# Patient Record
Sex: Male | Born: 1965 | Race: White | Hispanic: No | Marital: Married | Smoking: Former smoker
Health system: Southern US, Community
[De-identification: ages and names within clinical notes are randomized; demographics above are authoritative.]

## PROBLEM LIST (undated history)

## (undated) DIAGNOSIS — J189 Pneumonia, unspecified organism: Secondary | ICD-10-CM

## (undated) DIAGNOSIS — K5790 Diverticulosis of intestine, part unspecified, without perforation or abscess without bleeding: Secondary | ICD-10-CM

## (undated) DIAGNOSIS — M1990 Unspecified osteoarthritis, unspecified site: Secondary | ICD-10-CM

## (undated) DIAGNOSIS — I700 Atherosclerosis of aorta: Secondary | ICD-10-CM

## (undated) DIAGNOSIS — J432 Centrilobular emphysema: Secondary | ICD-10-CM

## (undated) DIAGNOSIS — K635 Polyp of colon: Secondary | ICD-10-CM

## (undated) DIAGNOSIS — K219 Gastro-esophageal reflux disease without esophagitis: Secondary | ICD-10-CM

## (undated) DIAGNOSIS — E559 Vitamin D deficiency, unspecified: Secondary | ICD-10-CM

## (undated) DIAGNOSIS — K654 Sclerosing mesenteritis: Secondary | ICD-10-CM

## (undated) DIAGNOSIS — I7121 Aneurysm of the ascending aorta, without rupture: Secondary | ICD-10-CM

## (undated) DIAGNOSIS — E538 Deficiency of other specified B group vitamins: Secondary | ICD-10-CM

## (undated) HISTORY — PX: TONSILLECTOMY: SUR1361

## (undated) HISTORY — DX: Morbid (severe) obesity due to excess calories: E66.01

## (undated) HISTORY — PX: COLONOSCOPY W/ POLYPECTOMY: SHX1380

## (undated) HISTORY — PX: JOINT REPLACEMENT: SHX530

## (undated) HISTORY — PX: HERNIA REPAIR: SHX51

---

## 1998-04-11 HISTORY — PX: APPENDECTOMY: SHX54

## 2007-02-09 ENCOUNTER — Emergency Department: Payer: Self-pay | Admitting: Emergency Medicine

## 2007-07-24 ENCOUNTER — Ambulatory Visit: Payer: Self-pay | Admitting: Unknown Physician Specialty

## 2007-08-10 ENCOUNTER — Ambulatory Visit: Payer: Self-pay | Admitting: Unknown Physician Specialty

## 2013-11-20 ENCOUNTER — Ambulatory Visit: Payer: Self-pay | Admitting: Physician Assistant

## 2018-10-11 ENCOUNTER — Other Ambulatory Visit: Payer: Self-pay

## 2018-10-24 DIAGNOSIS — M19071 Primary osteoarthritis, right ankle and foot: Secondary | ICD-10-CM | POA: Insufficient documentation

## 2018-10-24 DIAGNOSIS — M1712 Unilateral primary osteoarthritis, left knee: Secondary | ICD-10-CM | POA: Insufficient documentation

## 2018-10-24 DIAGNOSIS — M1711 Unilateral primary osteoarthritis, right knee: Secondary | ICD-10-CM | POA: Insufficient documentation

## 2018-10-25 ENCOUNTER — Other Ambulatory Visit: Payer: Self-pay | Admitting: Orthopedic Surgery

## 2018-10-25 DIAGNOSIS — M25362 Other instability, left knee: Secondary | ICD-10-CM

## 2018-10-25 DIAGNOSIS — M1712 Unilateral primary osteoarthritis, left knee: Secondary | ICD-10-CM

## 2018-10-25 DIAGNOSIS — M2392 Unspecified internal derangement of left knee: Secondary | ICD-10-CM

## 2018-10-25 DIAGNOSIS — M2352 Chronic instability of knee, left knee: Secondary | ICD-10-CM

## 2018-11-03 ENCOUNTER — Ambulatory Visit
Admission: RE | Admit: 2018-11-03 | Discharge: 2018-11-03 | Disposition: A | Discharge status: Home / Self Care | Payer: Managed Care, Other (non HMO) | Source: Ambulatory Visit | Attending: Orthopedic Surgery | Admitting: Orthopedic Surgery

## 2018-11-03 ENCOUNTER — Other Ambulatory Visit: Payer: Self-pay

## 2018-11-03 DIAGNOSIS — M2392 Unspecified internal derangement of left knee: Secondary | ICD-10-CM

## 2018-11-03 DIAGNOSIS — M2352 Chronic instability of knee, left knee: Secondary | ICD-10-CM

## 2018-11-03 DIAGNOSIS — M25362 Other instability, left knee: Secondary | ICD-10-CM

## 2018-11-03 DIAGNOSIS — M1712 Unilateral primary osteoarthritis, left knee: Secondary | ICD-10-CM

## 2018-12-18 DIAGNOSIS — K429 Umbilical hernia without obstruction or gangrene: Secondary | ICD-10-CM | POA: Insufficient documentation

## 2019-02-10 NOTE — Discharge Instructions (Signed)
Instructions after Total Knee Replacement   Latia Mataya P. Kairyn Olmeda, Jr., M.D.     Dept. of Orthopaedics & Sports Medicine  [facility]  [address]  [place], [place]  [address]  Phone: [phone]   Fax: [phone]    DIET: Drink plenty of non-alcoholic fluids. Resume your normal diet. Include foods high in fiber.  ACTIVITY:  You may use crutches or a walker with weight-bearing as tolerated, unless instructed otherwise. You may be weaned off of the walker or crutches by your Physical Therapist.  Do NOT place pillows under the knee. Anything placed under the knee could limit your ability to straighten the knee.   Continue doing gentle exercises. Exercising will reduce the pain and swelling, increase motion, and prevent muscle weakness.   Please continue to use the TED compression stockings for 6 weeks. You may remove the stockings at night, but should reapply them in the morning. Do not drive or operate any equipment until instructed.  WOUND CARE:  Continue to use the PolarCare or ice packs periodically to reduce pain and swelling. You may bathe or shower after the staples are removed at the first office visit following surgery.  MEDICATIONS: You may resume your regular medications. Please take the pain medication as prescribed on the medication. Do not take pain medication on an empty stomach. You have been given a prescription for a blood thinner (Lovenox or Coumadin). Please take the medication as instructed. (NOTE: After completing a 2 week course of Lovenox, take one Enteric-coated aspirin once a day. This along with elevation will help reduce the possibility of phlebitis in your operated leg.) Do not drive or drink alcoholic beverages when taking pain medications.  CALL THE OFFICE FOR: Temperature above 101 degrees Excessive bleeding or drainage on the dressing. Excessive swelling, coldness, or paleness of the toes. Persistent nausea and vomiting.  FOLLOW-UP:  You  should have an appointment to return to the office in 10-14 days after surgery. Arrangements have been made for continuation of Physical Therapy (either home therapy or outpatient therapy).   [facility] Department Directory         [email or web address]       [email or web address]          Cardiology  Appointments: [place] - [phone] [place] - [phone]  Endocrinology  Appointments: [place] - [phone] [place] - [phone]  Gastroenterology  Appointments: [place] - [phone] [place] - [phone]        General Surgery   Appointments: [place] - [phone]  Internal Medicine/Family Medicine  Appointments: [place] - [phone] [place] - [phone] [place] - [phone]  Metabolic and Weigh Loss Surgery  Appointments: [place] - [phone]        Neurology  Appointments: [place] - [phone] [place] - [phone]  Neurosurgery  Appointments: [place] - [phone]  Obstetrics & Gynecology  Appointments: [place] - [phone] [place] - [phone]        Pediatrics  Appointments: [place] - [phone] [place] - [phone]  Physiatry  Appointments: [place] -[phone]  Physical Therapy  Appointments: [place] - [phone] [place] - [phone]        Podiatry  Appointments: [place] - [phone] [place] - [phone]  Pulmonology  Appointments: [place] - [phone]  Rheumatology  Appointments: [place] - [phone]        [place] Location: [facility]  [address] , [place]  [address]  [place] Location: [facility] [address] [place], [place]  [address]  [place] Location: [facility] [address] [place], [place]  [address]    

## 2019-02-10 NOTE — H&P (Signed)
. Encounter Date: 02/07/2019 . Gwenlyn Fudge, PA  [place] [facility] Chief Complaint:       Chief Complaint  Patient presents with  . Knee Pain    H & P LEFT KNEE    History of Present Illness:    Derek Joseph is a 53 y.o. male that presents to clinic today for his preoperative history and evaluation.  Patient presents unaccompanied. The patient is scheduled to undergo a left total knee arthroplasty on 02/20/19 by Dr. Marry Guan. His pain began approximately 4 years ago.  The pain is located primarily along the medial aspect of the knee. He reports associated increase in pain with weightbearing.  He denies associated numbness or tingling.   The patient's symptoms have progressed to the point that they decrease his quality of life. The patient has previously undergone conservative treatment including NSAIDS and activity modification without adequate control of his symptoms.  Patient denies any history of blood clots or cardiac issues.  Past Medical, Surgical, Family, Social History, Allergies, Medications:   Past Medical History: History reviewed. No pertinent past medical history.  Past Surgical History:       Past Surgical History:  Procedure Laterality Date  . APPENDECTOMY    . COLONOSCOPY W/BIOPSY  01/07/2016   Procedure: COLONOSCOPY, FLEXIBLE; WITH BIOPSY, SINGLE OR MULTIPLE;  Surgeon: Cora Daniels, MD;  Location: [facility] ENDO/BRONCH;  Service: Gastroenterology;;  . COLONOSCOPY W/REMOVAL LESIONS BY SNARE  01/07/2016   Procedure: COLONOSCOPY, FLEXIBLE; WITH REMOVAL OF TUMOR(S), POLYP(S), OR OTHER LESION(S) BY SNARE TECHNIQUE;  Surgeon: Cora Daniels, MD;  Location: [facility] ENDO/BRON[facility];  Service: Gastroenterology;;  . TONSILLECTOMY      Current Medications:        Current Outpatient Medications  Medication Sig Dispense Refill  . ibuprofen (MOTRIN) 600 MG tablet Take 600 mg by mouth every 8 (eight) hours as  needed       . meloxicam (MOBIC) 15 MG tablet Take 15 mg by mouth once daily        No current facility-administered medications for this visit.     Allergies:       Allergies  Allergen Reactions  . Codeine Other (See Comments) and Abdominal Pain    Severe constipation Constipation: immediate and severe    Social History:  Social History        Socioeconomic History  . Marital status: Married    Spouse name: [name]  . Number of children: 1  . Years of education: 41  . Highest education level: Not on file  Occupational History  . Occupation: Astronomer  Social Needs  . Financial resource strain: Not on file  . Food insecurity    Worry: Not on file    Inability: Not on file  . Transportation needs    Medical: Not on file    Non-medical: Not on file  Tobacco Use  . Smoking status: Former Smoker    Packs/day: 1.50    Years: 35.00    Pack years: 52.50    Types: Cigarettes    Quit date: 11/18/2012    Years since quitting: 6.2  . Smokeless tobacco: Never Used  Substance and Sexual Activity  . Alcohol use: Yes    Comment: every now and then  . Drug use: Yes    Types: Marijuana    Comment: every now and then  . Sexual activity: Yes    Partners: Female  Lifestyle  . Physical activity  Days per week: Not on file    Minutes per session: Not on file  . Stress: Not on file  Relationships  . Social Herbalist on phone: Not on file    Gets together: Not on file    Attends religious service: Not on file    Active member of club or organization: Not on file    Attends meetings of clubs or organizations: Not on file    Relationship status: Not on file  Other Topics Concern  . 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 Coronary Artery Disease (Blocked arteries around heart) Mother 79       MI, died  . Bipolar disorder Mother   . Depression Mother   . Skin cancer Mother        melanoma  . Asthma Son        as a young child  . Colon cancer Maternal Aunt   . No Known Problems Father   . Colon polyps Sister   . No Known Problems Brother   . Coronary Artery Disease (Blocked arteries around heart) Paternal Grandmother   . Diverticulitis Sister     Review of Systems:   A 10+ ROS was performed, reviewed, and the pertinent orthopaedic findings are documented in the HPI.    Physical Examination:   BP 130/80   Ht 167.6 cm (5\' 6" )   Wt (!) 123.9 kg (273 lb 3.2 oz)   BMI 44.10 kg/m   Patient is a well-developed, well-nourished male in no acute distress. Patient has normal mood and affect. Patient is alert and oriented to person, place, and time. Pupils are equal and round with synchronous movement. No injected sclera.    HEENT: Pupils equal and reactive to light, no injected sclera  Cardiovascular: Regular rate and rhythm, with no murmurs, rubs, or gallops.  Distal pulses palpable.  Respiratory: Lungs clear to auscultation bilaterally.   LeftKnee: Soft tissue swelling:mild Effusion:minimal Erythema:none Crepitance:mild Tenderness:medial Alignment:relative varus Mediolateral laxity:medial pseudolaxity Posterior BK:[id number] Patellar tracking:Good tracking without evidence of subluxation or tilt Atrophy:No significantatrophy.  Quadriceps tone was good. Range of motion:0/0/106degrees  Sensation intact over the saphenous, lateral sural cutaneous, superficial  fibular, and deep fibular nerve distributions.  Tests Performed/Reviewed:  X-rays  Anteroposterior, lateral, and sunrise views of the left knee were obtained.  Anteroposterior views reveal moderate to severe narrowing of the medial joint space with associated subchondral sclerosis.  Compartment shows moderate loss of joint space.  Osteophyte formation noted.  Lateral view reveals complete loss of medial joint space with bone-on-bone contact.  Fabella noted.  Patellofemoral view reveals loss of joint space with osteophyte formation.  Impression:     ICD-10-CM  1. Acute pain of left knee  M25.562  2. Primary osteoarthritis of left knee  M17.12   Plan:   The patient has end-stage degenerative changes of the left knee.  It was explained to the patient that the condition is progressive in nature.  Having failed conservative treatment, the patient has elected to proceed with a total joint arthroplasty.  The patient will undergo a total joint arthroplasty with Dr. Marry Guan.  The risks of surgery, including blood clot and infection, were discussed with the patient.  Measures to reduce these risks, including the use of anticoagulation, perioperative antibiotics, and early ambulation were discussed.  The importance of postoperative physical therapy was discussed with the patient. The patient elects to proceed with surgery. The patient is instructed to stop all blood thinners prior to surgery.  The patient is instructed to call the hospital the day before surgery to learn of the proper arrival time.    Contact our office with any questions or concerns.  Follow up as indicated, or sooner should any new problems arise, if conditions worsen, or if they are otherwise concerned.   Gwenlyn Fudge, PA [place] [facility] [place], [place] [address] Phone: [phone]  This note was generated in part with voice recognition software and I apologize  for any typographical errors that were not detected and corrected.

## 2019-02-11 ENCOUNTER — Encounter
Admission: RE | Admit: 2019-02-11 | Discharge: 2019-02-11 | Disposition: A | Discharge status: Home / Self Care | Payer: Managed Care, Other (non HMO) | Source: Ambulatory Visit | Attending: Orthopedic Surgery | Admitting: Orthopedic Surgery

## 2019-02-11 ENCOUNTER — Other Ambulatory Visit: Payer: Self-pay

## 2019-02-11 DIAGNOSIS — Z01810 Encounter for preprocedural cardiovascular examination: Secondary | ICD-10-CM | POA: Diagnosis not present

## 2019-02-11 DIAGNOSIS — Z01818 Encounter for other preprocedural examination: Secondary | ICD-10-CM | POA: Diagnosis not present

## 2019-02-11 HISTORY — DX: Unspecified osteoarthritis, unspecified site: M19.90

## 2019-02-11 LAB — COMPREHENSIVE METABOLIC PANEL
ALT: 22 U/L (ref 0–44)
AST: 17 U/L (ref 15–41)
Albumin: 4.2 g/dL (ref 3.5–5.0)
Alkaline Phosphatase: 107 U/L (ref 38–126)
Anion gap: 9 (ref 5–15)
BUN: 15 mg/dL (ref 6–20)
CO2: 28 mmol/L (ref 22–32)
Calcium: 9.2 mg/dL (ref 8.9–10.3)
Chloride: 100 mmol/L (ref 98–111)
Creatinine, Ser: 0.97 mg/dL (ref 0.61–1.24)
GFR calc Af Amer: >60 mL/min (ref >60)
GFR calc non Af Amer: >60 mL/min (ref >60)
Glucose, Bld: 75 mg/dL (ref 70–99)
Potassium: 4.3 mmol/L (ref 3.5–5.1)
Sodium: 137 mmol/L (ref 135–145)
Total Bilirubin: 0.6 mg/dL (ref 0.3–1.2)
Total Protein: 7.8 g/dL (ref 6.5–8.1)

## 2019-02-11 LAB — CBC
HCT: 48 % (ref 39.0–52.0)
Hemoglobin: 16.2 g/dL (ref 13.0–17.0)
MCH: 30.1 pg (ref 26.0–34.0)
MCHC: 33.8 g/dL (ref 30.0–36.0)
MCV: 89.1 fL (ref 80.0–100.0)
Platelets: 180 10*3/uL (ref 150–400)
RBC: 5.39 MIL/uL (ref 4.22–5.81)
RDW: 12 % (ref 11.5–15.5)
WBC: 6.9 10*3/uL (ref 4.0–10.5)
nRBC: 0 % (ref 0.0–0.2)

## 2019-02-11 LAB — SURGICAL PCR SCREEN
MRSA, PCR: NEGATIVE
Staphylococcus aureus: NEGATIVE

## 2019-02-11 LAB — TYPE AND SCREEN
ABO/RH(D): B POS
Antibody Screen: NEGATIVE

## 2019-02-11 LAB — PROTIME-INR
INR: 1 (ref 0.8–1.2)
Prothrombin Time: 12.6 seconds (ref 11.4–15.2)

## 2019-02-11 LAB — URINALYSIS, ROUTINE W REFLEX MICROSCOPIC
Bilirubin Urine: NEGATIVE
Glucose, UA: NEGATIVE mg/dL
Hgb urine dipstick: NEGATIVE
Ketones, ur: NEGATIVE mg/dL
Leukocytes,Ua: NEGATIVE
Nitrite: NEGATIVE
Protein, ur: NEGATIVE mg/dL
Specific Gravity, Urine: 1.009 (ref 1.005–1.030)
pH: 5 (ref 5.0–8.0)

## 2019-02-11 LAB — SEDIMENTATION RATE: Sed Rate: 6 mm/hr (ref 0–20)

## 2019-02-11 LAB — C-REACTIVE PROTEIN: CRP: <0.8 mg/dL (ref <1.0)

## 2019-02-11 LAB — APTT: aPTT: 29 seconds (ref 24–36)

## 2019-02-11 NOTE — Patient Instructions (Signed)
Your procedure is scheduled on: 02-20-19 [facility] Report to Same Day Surgery 2nd floor medical mall [facility] Entrance-take elevator on left to 2nd floor.  Check in with surgery information desk.) To find out your arrival time please call [phone] between 1PM - 3PM on 02-19-19 TUESDAY  Remember: Instructions that are not followed completely may result in serious medical risk, up to and including death, or upon the discretion of your surgeon and anesthesiologist your surgery may need to be rescheduled.    _x___ 1. Do not eat food after midnight the night before your procedure. NO GUM OR CANDY AFTER MIDNIGHT. You may drink clear liquids up to 2 hours before you are scheduled to arrive at the hospital for your procedure.  Do not drink clear liquids within 2 hours of your scheduled arrival to the hospital.  Clear liquids include  --Water or Apple juice without pulp  --Gatorade  --Black Coffee or Clear Tea (No milk, no creamers, do not add anything to the coffee or Tea   ____Ensure clear carbohydrate drink on the way to the hospital for bariatric patients  _X___Ensure clear carbohydrate drink 3 hours before surgery.     __x__ 2. No Alcohol for 24 hours before or after surgery.   __x__3. No Smoking or e-cigarettes for 24 prior to surgery.  Do not use any chewable tobacco products for at least 6 hour prior to surgery   ____  4. Bring all medications with you on the day of surgery if instructed.    __x__ 5. Notify your doctor if there is any change in your medical condition     (cold, fever, infections).    x___6. On the morning of surgery brush your teeth with toothpaste and water.  You may rinse your mouth with mouth wash if you wish.  Do not swallow any toothpaste or mouthwash.   Do not wear jewelry, make-up, hairpins, clips or nail polish.  Do not wear lotions, powders, or perfumes.   Do not shave 48 hours prior to surgery. Men may shave face and neck.  Do not bring valuables  to the hospital.    [facility] is not responsible for any belongings or valuables.               Contacts, dentures or bridgework may not be worn into surgery.  Leave your suitcase in the car. After surgery it may be brought to your room.  For patients admitted to the hospital, discharge time is determined by your treatment team.  _  Patients discharged the day of surgery will not be allowed to drive home.  You will need someone to drive you home and stay with you the night of your procedure.    Please read over the following fact sheets that you were given:   [facility] Preparing for Surgery and or MRSA Information   ____ Take anti-hypertensive listed below, cardiac, seizure, asthma, anti-reflux and psychiatric medicines. These include:  1. NONE  2.  3.  4.  5.  6.  ____Fleets enema or Magnesium Citrate as directed.   _x___ Use CHG Soap or sage wipes as directed on instruction sheet   ____ Use inhalers on the day of surgery and bring to hospital day of surgery  ____ Stop Metformin and Janumet 2 days prior to surgery.    ____ Take 1/2 of usual insulin dose the night before surgery and none on the morning surgery.   ____ Follow recommendations from Cardiologist, Pulmonologist or PCP  regarding stopping Aspirin, Coumadin, Plavix ,Eliquis, Effient, or Pradaxa, and Pletal.  X____Stop Anti-inflammatories such as Advil, Aleve, Ibuprofen, Motrin, Naproxen, MELOXICAM, Naprosyn, Goodies powders or aspirin products 7 DAYS PRIOR TO SURGERY- OK to take Tylenol    ____ Stop supplements until after surgery.     ____ Bring C-Pap to the hospital.

## 2019-02-12 LAB — URINE CULTURE
Culture: NO GROWTH
Special Requests: NORMAL

## 2019-02-15 ENCOUNTER — Other Ambulatory Visit
Admission: RE | Admit: 2019-02-15 | Discharge: 2019-02-15 | Disposition: A | Discharge status: Home / Self Care | Payer: Managed Care, Other (non HMO) | Source: Ambulatory Visit | Attending: Orthopedic Surgery | Admitting: Orthopedic Surgery

## 2019-02-15 ENCOUNTER — Other Ambulatory Visit: Payer: Self-pay

## 2019-02-15 DIAGNOSIS — Z01812 Encounter for preprocedural laboratory examination: Secondary | ICD-10-CM | POA: Insufficient documentation

## 2019-02-15 DIAGNOSIS — Z20828 Contact with and (suspected) exposure to other viral communicable diseases: Secondary | ICD-10-CM | POA: Insufficient documentation

## 2019-02-15 LAB — SARS CORONAVIRUS 2 (TAT 6-24 HRS): SARS Coronavirus 2: NEGATIVE

## 2019-02-19 MED ORDER — TRANEXAMIC ACID-NACL 1000-0.7 MG/100ML-% IV SOLN
1000.0000 mg | INTRAVENOUS | Status: AC
  Administered 2019-02-20: 1000 mg via INTRAVENOUS
  Filled 2019-02-19: qty 100

## 2019-02-20 ENCOUNTER — Inpatient Hospital Stay: Payer: Managed Care, Other (non HMO)

## 2019-02-20 ENCOUNTER — Encounter: Payer: Self-pay | Admitting: Orthopedic Surgery

## 2019-02-20 ENCOUNTER — Inpatient Hospital Stay
Admission: RE | Admit: 2019-02-20 | Discharge: 2019-02-22 | DRG: 470 | Disposition: A | Discharge status: Home Health | Payer: Managed Care, Other (non HMO) | Source: Ambulatory Visit | Attending: Orthopedic Surgery | Admitting: Orthopedic Surgery

## 2019-02-20 ENCOUNTER — Encounter
Admission: RE | Disposition: A | Discharge status: Home Health | Payer: Self-pay | Source: Ambulatory Visit | Attending: Orthopedic Surgery

## 2019-02-20 ENCOUNTER — Inpatient Hospital Stay: Payer: Managed Care, Other (non HMO) | Admitting: Anesthesiology

## 2019-02-20 ENCOUNTER — Other Ambulatory Visit: Payer: Self-pay

## 2019-02-20 DIAGNOSIS — Z885 Allergy status to narcotic agent status: Secondary | ICD-10-CM

## 2019-02-20 DIAGNOSIS — M1712 Unilateral primary osteoarthritis, left knee: Principal | ICD-10-CM | POA: Diagnosis present

## 2019-02-20 DIAGNOSIS — Z87891 Personal history of nicotine dependence: Secondary | ICD-10-CM

## 2019-02-20 DIAGNOSIS — Z79899 Other long term (current) drug therapy: Secondary | ICD-10-CM

## 2019-02-20 DIAGNOSIS — Z86010 Personal history of colonic polyps: Secondary | ICD-10-CM

## 2019-02-20 DIAGNOSIS — M25562 Pain in left knee: Secondary | ICD-10-CM | POA: Diagnosis present

## 2019-02-20 DIAGNOSIS — Z6841 Body Mass Index (BMI) 40.0 and over, adult: Secondary | ICD-10-CM

## 2019-02-20 DIAGNOSIS — Z800 Family history of malignant neoplasm of digestive organs: Secondary | ICD-10-CM | POA: Diagnosis not present

## 2019-02-20 DIAGNOSIS — Z96659 Presence of unspecified artificial knee joint: Secondary | ICD-10-CM

## 2019-02-20 DIAGNOSIS — Z96652 Presence of left artificial knee joint: Secondary | ICD-10-CM

## 2019-02-20 HISTORY — PX: KNEE ARTHROPLASTY: SHX992

## 2019-02-20 LAB — URINE DRUG SCREEN, QUALITATIVE (ARMC ONLY)
Amphetamines, Ur Screen: NOT DETECTED
Barbiturates, Ur Screen: NOT DETECTED
Benzodiazepine, Ur Scrn: NOT DETECTED
Cannabinoid 50 Ng, Ur ~~LOC~~: NOT DETECTED
Cocaine Metabolite,Ur ~~LOC~~: NOT DETECTED
MDMA (Ecstasy)Ur Screen: NOT DETECTED
Methadone Scn, Ur: NOT DETECTED
Opiate, Ur Screen: NOT DETECTED
Phencyclidine (PCP) Ur S: NOT DETECTED
Tricyclic, Ur Screen: NOT DETECTED

## 2019-02-20 LAB — ABO/RH: ABO/RH(D): B POS

## 2019-02-20 SURGERY — ARTHROPLASTY, KNEE, TOTAL, USING IMAGELESS COMPUTER-ASSISTED NAVIGATION
Anesthesia: Spinal | Site: Knee | Laterality: Left

## 2019-02-20 MED ORDER — BUPIVACAINE HCL (PF) 0.5 % IJ SOLN
INTRAMUSCULAR | Status: DC | PRN
  Administered 2019-02-20: 3 mL

## 2019-02-20 MED ORDER — NEOMYCIN-POLYMYXIN B GU 40-200000 IR SOLN
Status: AC
  Filled 2019-02-20: qty 40

## 2019-02-20 MED ORDER — DIPHENHYDRAMINE HCL 12.5 MG/5ML PO ELIX: 12.5000 mg | ORAL_SOLUTION | ORAL | Status: DC | PRN

## 2019-02-20 MED ORDER — CELECOXIB 200 MG PO CAPS
ORAL_CAPSULE | ORAL | Status: AC
  Administered 2019-02-20: 400 mg via ORAL
  Filled 2019-02-20: qty 2

## 2019-02-20 MED ORDER — TRANEXAMIC ACID-NACL 1000-0.7 MG/100ML-% IV SOLN
1000.0000 mg | Freq: Once | INTRAVENOUS | Status: AC
  Administered 2019-02-20: 1000 mg via INTRAVENOUS
  Filled 2019-02-20: qty 100

## 2019-02-20 MED ORDER — METOCLOPRAMIDE HCL 5 MG/ML IJ SOLN: 5.0000 mg | Freq: Three times a day (TID) | INTRAMUSCULAR | Status: DC | PRN

## 2019-02-20 MED ORDER — PROPOFOL 10 MG/ML IV BOLUS
INTRAVENOUS | Status: AC
  Filled 2019-02-20: qty 20

## 2019-02-20 MED ORDER — MIDAZOLAM HCL 2 MG/2ML IJ SOLN
INTRAMUSCULAR | Status: AC
  Filled 2019-02-20: qty 2

## 2019-02-20 MED ORDER — TETRACAINE HCL 1 % IJ SOLN
INTRAMUSCULAR | Status: DC | PRN
  Administered 2019-02-20: 2 mg via INTRASPINAL

## 2019-02-20 MED ORDER — GABAPENTIN 300 MG PO CAPS
300.0000 mg | ORAL_CAPSULE | Freq: Every day | ORAL | Status: DC
  Administered 2019-02-20 – 2019-02-21 (×2): 300 mg via ORAL
  Filled 2019-02-20 (×2): qty 1

## 2019-02-20 MED ORDER — CEFAZOLIN SODIUM-DEXTROSE 2-4 GM/100ML-% IV SOLN
INTRAVENOUS | Status: AC
  Filled 2019-02-20: qty 100

## 2019-02-20 MED ORDER — PROPOFOL 10 MG/ML IV BOLUS
INTRAVENOUS | Status: DC | PRN
  Administered 2019-02-20 (×3): 10 mg via INTRAVENOUS
  Administered 2019-02-20: 20 mg via INTRAVENOUS
  Administered 2019-02-20: 40 mg via INTRAVENOUS

## 2019-02-20 MED ORDER — ACETAMINOPHEN 10 MG/ML IV SOLN
1000.0000 mg | Freq: Four times a day (QID) | INTRAVENOUS | Status: AC
  Administered 2019-02-20 – 2019-02-21 (×3): 1000 mg via INTRAVENOUS
  Filled 2019-02-20 (×3): qty 100

## 2019-02-20 MED ORDER — FAMOTIDINE 20 MG PO TABS
ORAL_TABLET | ORAL | Status: AC
  Administered 2019-02-20: 20 mg via ORAL
  Filled 2019-02-20: qty 1

## 2019-02-20 MED ORDER — PROPOFOL 500 MG/50ML IV EMUL
INTRAVENOUS | Status: AC
  Filled 2019-02-20: qty 50

## 2019-02-20 MED ORDER — EPHEDRINE SULFATE 50 MG/ML IJ SOLN
INTRAMUSCULAR | Status: DC | PRN
  Administered 2019-02-20 (×2): 10 mg via INTRAVENOUS

## 2019-02-20 MED ORDER — ACETAMINOPHEN 325 MG PO TABS: 325.0000 mg | ORAL_TABLET | Freq: Four times a day (QID) | ORAL | Status: DC | PRN

## 2019-02-20 MED ORDER — METOCLOPRAMIDE HCL 10 MG PO TABS: 5.0000 mg | ORAL_TABLET | Freq: Three times a day (TID) | ORAL | Status: DC | PRN

## 2019-02-20 MED ORDER — PHENYLEPHRINE HCL (PRESSORS) 10 MG/ML IV SOLN
INTRAVENOUS | Status: DC | PRN
  Administered 2019-02-20: 100 ug via INTRAVENOUS
  Administered 2019-02-20: 50 ug via INTRAVENOUS
  Administered 2019-02-20 (×2): 100 ug via INTRAVENOUS
  Administered 2019-02-20: 50 ug via INTRAVENOUS
  Administered 2019-02-20 (×2): 100 ug via INTRAVENOUS
  Administered 2019-02-20: 50 ug via INTRAVENOUS
  Administered 2019-02-20: 100 ug via INTRAVENOUS

## 2019-02-20 MED ORDER — BUPIVACAINE HCL (PF) 0.25 % IJ SOLN
INTRAMUSCULAR | Status: DC | PRN
  Administered 2019-02-20: 60 mL

## 2019-02-20 MED ORDER — CELECOXIB 200 MG PO CAPS
400.0000 mg | ORAL_CAPSULE | Freq: Once | ORAL | Status: AC
  Administered 2019-02-20: 07:00:00 400 mg via ORAL

## 2019-02-20 MED ORDER — PROPOFOL 10 MG/ML IV BOLUS
INTRAVENOUS | Status: AC
  Filled 2019-02-20: qty 40

## 2019-02-20 MED ORDER — SODIUM CHLORIDE 0.9 % IV SOLN
INTRAVENOUS | Status: DC | PRN
  Administered 2019-02-20: 60 mL

## 2019-02-20 MED ORDER — FENTANYL CITRATE (PF) 100 MCG/2ML IJ SOLN: 25.0000 ug | INTRAMUSCULAR | Status: DC | PRN

## 2019-02-20 MED ORDER — OXYCODONE HCL 5 MG PO TABS
5.0000 mg | ORAL_TABLET | ORAL | Status: DC | PRN
  Administered 2019-02-20 – 2019-02-21 (×3): 5 mg via ORAL
  Filled 2019-02-20 (×4): qty 1

## 2019-02-20 MED ORDER — ONDANSETRON HCL 4 MG PO TABS: 4.0000 mg | ORAL_TABLET | Freq: Four times a day (QID) | ORAL | Status: DC | PRN

## 2019-02-20 MED ORDER — DEXMEDETOMIDINE HCL IN NACL 80 MCG/20ML IV SOLN
INTRAVENOUS | Status: AC
  Filled 2019-02-20: qty 20

## 2019-02-20 MED ORDER — PHENYLEPHRINE HCL (PRESSORS) 10 MG/ML IV SOLN
INTRAVENOUS | Status: AC
  Filled 2019-02-20: qty 1

## 2019-02-20 MED ORDER — METOCLOPRAMIDE HCL 10 MG PO TABS
10.0000 mg | ORAL_TABLET | Freq: Three times a day (TID) | ORAL | Status: DC
  Administered 2019-02-20 – 2019-02-22 (×6): 10 mg via ORAL
  Filled 2019-02-20 (×6): qty 1

## 2019-02-20 MED ORDER — ALUM & MAG HYDROXIDE-SIMETH 200-200-20 MG/5ML PO SUSP: 30.0000 mL | ORAL | Status: DC | PRN

## 2019-02-20 MED ORDER — ENSURE PRE-SURGERY PO LIQD
296.0000 mL | Freq: Once | ORAL | Status: DC
  Filled 2019-02-20: qty 296

## 2019-02-20 MED ORDER — SODIUM CHLORIDE FLUSH 0.9 % IV SOLN
INTRAVENOUS | Status: AC
  Filled 2019-02-20: qty 80

## 2019-02-20 MED ORDER — SENNOSIDES-DOCUSATE SODIUM 8.6-50 MG PO TABS
1.0000 | ORAL_TABLET | Freq: Two times a day (BID) | ORAL | Status: DC
  Administered 2019-02-20 – 2019-02-22 (×4): 1 via ORAL
  Filled 2019-02-20 (×4): qty 1

## 2019-02-20 MED ORDER — OXYCODONE HCL 5 MG PO TABS
10.0000 mg | ORAL_TABLET | ORAL | Status: DC | PRN
  Administered 2019-02-20 – 2019-02-22 (×3): 10 mg via ORAL
  Filled 2019-02-20 (×4): qty 2

## 2019-02-20 MED ORDER — MIDAZOLAM HCL 5 MG/5ML IJ SOLN
INTRAMUSCULAR | Status: DC | PRN
  Administered 2019-02-20: 2 mg via INTRAVENOUS

## 2019-02-20 MED ORDER — ACETAMINOPHEN 10 MG/ML IV SOLN
INTRAVENOUS | Status: DC | PRN
  Administered 2019-02-20: 1000 mg via INTRAVENOUS

## 2019-02-20 MED ORDER — HYDROMORPHONE HCL 1 MG/ML IJ SOLN: 0.5000 mg | INTRAMUSCULAR | Status: DC | PRN

## 2019-02-20 MED ORDER — PHENOL 1.4 % MT LIQD
1.0000 | OROMUCOSAL | Status: DC | PRN
  Filled 2019-02-20: qty 177

## 2019-02-20 MED ORDER — FLEET ENEMA 7-19 GM/118ML RE ENEM: 1.0000 | ENEMA | Freq: Once | RECTAL | Status: DC | PRN

## 2019-02-20 MED ORDER — CHLORHEXIDINE GLUCONATE 4 % EX LIQD: 60.0000 mL | Freq: Once | CUTANEOUS | Status: DC

## 2019-02-20 MED ORDER — CEFAZOLIN SODIUM-DEXTROSE 2-4 GM/100ML-% IV SOLN
2.0000 g | INTRAVENOUS | Status: AC
  Administered 2019-02-20: 2 g via INTRAVENOUS

## 2019-02-20 MED ORDER — BUPIVACAINE HCL (PF) 0.25 % IJ SOLN
INTRAMUSCULAR | Status: AC
  Filled 2019-02-20: qty 120

## 2019-02-20 MED ORDER — ONDANSETRON HCL 4 MG/2ML IJ SOLN
INTRAMUSCULAR | Status: AC
  Filled 2019-02-20: qty 2

## 2019-02-20 MED ORDER — ENOXAPARIN SODIUM 30 MG/0.3ML ~~LOC~~ SOLN
30.0000 mg | Freq: Two times a day (BID) | SUBCUTANEOUS | Status: DC
  Administered 2019-02-21 (×2): 30 mg via SUBCUTANEOUS
  Filled 2019-02-20 (×3): qty 0.3

## 2019-02-20 MED ORDER — PROPOFOL 500 MG/50ML IV EMUL
INTRAVENOUS | Status: DC | PRN
  Administered 2019-02-20: 110 ug/kg/min via INTRAVENOUS

## 2019-02-20 MED ORDER — FERROUS SULFATE 325 (65 FE) MG PO TABS
325.0000 mg | ORAL_TABLET | Freq: Two times a day (BID) | ORAL | Status: DC
  Administered 2019-02-21 – 2019-02-22 (×3): 325 mg via ORAL
  Filled 2019-02-20 (×3): qty 1

## 2019-02-20 MED ORDER — LIDOCAINE HCL (PF) 2 % IJ SOLN
INTRAMUSCULAR | Status: AC
  Filled 2019-02-20: qty 10

## 2019-02-20 MED ORDER — GLYCOPYRROLATE 0.2 MG/ML IJ SOLN
INTRAMUSCULAR | Status: DC | PRN
  Administered 2019-02-20: 0.2 mg via INTRAVENOUS

## 2019-02-20 MED ORDER — SUCCINYLCHOLINE CHLORIDE 20 MG/ML IJ SOLN
INTRAMUSCULAR | Status: AC
  Filled 2019-02-20: qty 1

## 2019-02-20 MED ORDER — CEFAZOLIN SODIUM-DEXTROSE 2-4 GM/100ML-% IV SOLN
2.0000 g | Freq: Four times a day (QID) | INTRAVENOUS | Status: AC
  Administered 2019-02-20 – 2019-02-21 (×4): 2 g via INTRAVENOUS
  Filled 2019-02-20 (×4): qty 100

## 2019-02-20 MED ORDER — ONDANSETRON HCL 4 MG/2ML IJ SOLN
INTRAMUSCULAR | Status: DC | PRN
  Administered 2019-02-20: 4 mg via INTRAVENOUS

## 2019-02-20 MED ORDER — LACTATED RINGERS IV SOLN
INTRAVENOUS | Status: DC
  Administered 2019-02-20 (×2): via INTRAVENOUS

## 2019-02-20 MED ORDER — CELECOXIB 200 MG PO CAPS
200.0000 mg | ORAL_CAPSULE | Freq: Two times a day (BID) | ORAL | Status: DC
  Administered 2019-02-20 – 2019-02-22 (×4): 200 mg via ORAL
  Filled 2019-02-20 (×4): qty 1

## 2019-02-20 MED ORDER — ONDANSETRON HCL 4 MG/2ML IJ SOLN: 4.0000 mg | Freq: Four times a day (QID) | INTRAMUSCULAR | Status: DC | PRN

## 2019-02-20 MED ORDER — BUPIVACAINE HCL (PF) 0.5 % IJ SOLN
INTRAMUSCULAR | Status: AC
  Filled 2019-02-20: qty 10

## 2019-02-20 MED ORDER — GABAPENTIN 300 MG PO CAPS
ORAL_CAPSULE | ORAL | Status: AC
  Administered 2019-02-20: 300 mg via ORAL
  Filled 2019-02-20: qty 1

## 2019-02-20 MED ORDER — GABAPENTIN 300 MG PO CAPS
300.0000 mg | ORAL_CAPSULE | Freq: Once | ORAL | Status: AC
  Administered 2019-02-20: 07:00:00 300 mg via ORAL

## 2019-02-20 MED ORDER — ONDANSETRON HCL 4 MG/2ML IJ SOLN: 4.0000 mg | Freq: Once | INTRAMUSCULAR | Status: DC | PRN

## 2019-02-20 MED ORDER — MENTHOL 3 MG MT LOZG
1.0000 | LOZENGE | OROMUCOSAL | Status: DC | PRN
  Filled 2019-02-20: qty 9

## 2019-02-20 MED ORDER — SODIUM CHLORIDE 0.9 % IV SOLN
INTRAVENOUS | Status: DC | PRN
  Administered 2019-02-20: 10 ug/min via INTRAVENOUS

## 2019-02-20 MED ORDER — PANTOPRAZOLE SODIUM 40 MG PO TBEC
40.0000 mg | DELAYED_RELEASE_TABLET | Freq: Two times a day (BID) | ORAL | Status: DC
  Administered 2019-02-20 – 2019-02-22 (×4): 40 mg via ORAL
  Filled 2019-02-20 (×4): qty 1

## 2019-02-20 MED ORDER — NEOMYCIN-POLYMYXIN B GU 40-200000 IR SOLN
Status: DC | PRN
  Administered 2019-02-20: 14 mL

## 2019-02-20 MED ORDER — ACETAMINOPHEN 10 MG/ML IV SOLN
INTRAVENOUS | Status: AC
  Filled 2019-02-20: qty 100

## 2019-02-20 MED ORDER — SODIUM CHLORIDE 0.9 % IV SOLN
INTRAVENOUS | Status: DC
  Administered 2019-02-20 – 2019-02-21 (×3): via INTRAVENOUS

## 2019-02-20 MED ORDER — OXYCODONE HCL 5 MG PO TABS
ORAL_TABLET | ORAL | Status: AC
  Filled 2019-02-20: qty 1

## 2019-02-20 MED ORDER — FAMOTIDINE 20 MG PO TABS
20.0000 mg | ORAL_TABLET | Freq: Once | ORAL | Status: AC
  Administered 2019-02-20: 07:00:00 20 mg via ORAL

## 2019-02-20 MED ORDER — BISACODYL 10 MG RE SUPP: 10.0000 mg | Freq: Every day | RECTAL | Status: DC | PRN

## 2019-02-20 MED ORDER — DEXAMETHASONE SODIUM PHOSPHATE 10 MG/ML IJ SOLN
INTRAMUSCULAR | Status: AC
  Administered 2019-02-20: 8 mg via INTRAVENOUS
  Filled 2019-02-20: qty 1

## 2019-02-20 MED ORDER — TRAMADOL HCL 50 MG PO TABS
50.0000 mg | ORAL_TABLET | ORAL | Status: DC | PRN
  Administered 2019-02-20 – 2019-02-21 (×4): 50 mg via ORAL
  Administered 2019-02-22: 100 mg via ORAL
  Filled 2019-02-20 (×2): qty 1
  Filled 2019-02-20: qty 2
  Filled 2019-02-20 (×2): qty 1

## 2019-02-20 MED ORDER — DEXAMETHASONE SODIUM PHOSPHATE 10 MG/ML IJ SOLN
8.0000 mg | Freq: Once | INTRAMUSCULAR | Status: AC
  Administered 2019-02-20: 07:00:00 8 mg via INTRAVENOUS

## 2019-02-20 MED ORDER — BUPIVACAINE LIPOSOME 1.3 % IJ SUSP
INTRAMUSCULAR | Status: AC
  Filled 2019-02-20: qty 40

## 2019-02-20 MED ORDER — MAGNESIUM HYDROXIDE 400 MG/5ML PO SUSP
30.0000 mL | Freq: Every day | ORAL | Status: DC
  Administered 2019-02-21 – 2019-02-22 (×2): 30 mL via ORAL
  Filled 2019-02-20 (×2): qty 30

## 2019-02-20 SURGICAL SUPPLY — 79 items
ATTUNE MED DOME PAT 41 KNEE (Knees) ×1 IMPLANT
ATTUNE MED DOME PAT 41MM KNEE (Knees) ×1 IMPLANT
ATTUNE PS FEM LT SZ 7 CEM KNEE (Femur) ×2 IMPLANT
ATTUNE PSRP INSR SZ7 5 KNEE (Insert) ×1 IMPLANT
ATTUNE PSRP INSR SZ7 5MM KNEE (Insert) ×1 IMPLANT
BASE TIBIAL ROT PLAT SZ 7 KNEE (Knees) IMPLANT
BATTERY INSTRU NAVIGATION (MISCELLANEOUS) ×12 IMPLANT
BLADE SAW 70X12.5 (BLADE) ×3 IMPLANT
BLADE SAW 90X13X1.19 OSCILLAT (BLADE) ×3 IMPLANT
BLADE SAW 90X25X1.19 OSCILLAT (BLADE) ×3 IMPLANT
BONE CEMENT GENTAMICIN (Cement) ×6 IMPLANT
CANISTER SUCT 3000ML PPV (MISCELLANEOUS) ×3 IMPLANT
CEMENT BONE GENTAMICIN 40 (Cement) IMPLANT
COOLER ICEMAN CLASSIC (MISCELLANEOUS) ×3 IMPLANT
COVER WAND RF STERILE (DRAPES) ×3 IMPLANT
CUFF TOURN SGL QUICK 24 (TOURNIQUET CUFF)
CUFF TOURN SGL QUICK 30 (TOURNIQUET CUFF)
CUFF TOURN SGL QUICK 34 (TOURNIQUET CUFF) ×2
CUFF TRNQT CYL 24X4X16.5-23 (TOURNIQUET CUFF) IMPLANT
CUFF TRNQT CYL 30X4X21-28X (TOURNIQUET CUFF) IMPLANT
CUFF TRNQT CYL 34X4.125X (TOURNIQUET CUFF) IMPLANT
DRAPE 3/4 80X56 (DRAPES) ×3 IMPLANT
DRSG DERMACEA 8X12 NADH (GAUZE/BANDAGES/DRESSINGS) ×3 IMPLANT
DRSG OPSITE POSTOP 4X14 (GAUZE/BANDAGES/DRESSINGS) ×3 IMPLANT
DRSG TEGADERM 4X4.75 (GAUZE/BANDAGES/DRESSINGS) ×3 IMPLANT
DURAPREP 26ML APPLICATOR (WOUND CARE) ×6 IMPLANT
ELECT REM PT RETURN 9FT ADLT (ELECTROSURGICAL) ×3
ELECTRODE REM PT RTRN 9FT ADLT (ELECTROSURGICAL) ×1 IMPLANT
EX-PIN ORTHOLOCK NAV 4X150 (PIN) ×6 IMPLANT
GLOVE BIO SURGEON STRL SZ7.5 (GLOVE) ×6 IMPLANT
GLOVE BIOGEL M STRL SZ7.5 (GLOVE) ×6 IMPLANT
GLOVE BIOGEL PI IND STRL 7.5 (GLOVE) ×1 IMPLANT
GLOVE BIOGEL PI INDICATOR 7.5 (GLOVE) ×2
GLOVE INDICATOR 8.0 STRL GRN (GLOVE) ×3 IMPLANT
GOWN STRL REUS W/ TWL LRG LVL3 (GOWN DISPOSABLE) ×2 IMPLANT
GOWN STRL REUS W/ TWL XL LVL3 (GOWN DISPOSABLE) ×1 IMPLANT
GOWN STRL REUS W/TWL LRG LVL3 (GOWN DISPOSABLE) ×4
GOWN STRL REUS W/TWL XL LVL3 (GOWN DISPOSABLE) ×2
HEMOVAC 400CC 10FR (MISCELLANEOUS) ×3 IMPLANT
HOLDER FOLEY CATH W/STRAP (MISCELLANEOUS) ×3 IMPLANT
HOOD PEEL AWAY FLYTE STAYCOOL (MISCELLANEOUS) ×6 IMPLANT
KIT TURNOVER KIT A (KITS) ×3 IMPLANT
KNIFE SCULPS 14X20 (INSTRUMENTS) ×3 IMPLANT
LABEL OR SOLS (LABEL) ×3 IMPLANT
MANIFOLD NEPTUNE II (INSTRUMENTS) ×3 IMPLANT
NDL SAFETY ECLIPSE 18X1.5 (NEEDLE) ×1 IMPLANT
NDL SPNL 20GX3.5 QUINCKE YW (NEEDLE) ×2 IMPLANT
NEEDLE HYPO 18GX1.5 SHARP (NEEDLE) ×2
NEEDLE SPNL 20GX3.5 QUINCKE YW (NEEDLE) ×6 IMPLANT
NS IRRIG 500ML POUR BTL (IV SOLUTION) ×3 IMPLANT
PACK TOTAL KNEE (MISCELLANEOUS) ×3 IMPLANT
PAD COLD SHLDR UNI WRAP-ON (PAD) ×3
PAD COLD UNI WRAP-ON (PAD) IMPLANT
PAD WRAPON POLAR KNEE (MISCELLANEOUS) ×1 IMPLANT
PENCIL SMOKE ULTRAEVAC 22 CON (MISCELLANEOUS) ×3 IMPLANT
PIN DRILL QUICK PACK ×3 IMPLANT
PIN FIXATION 1/8DIA X 3INL (PIN) ×9 IMPLANT
PULSAVAC PLUS IRRIG FAN TIP (DISPOSABLE) ×3
SOL .9 NS 3000ML IRR  AL (IV SOLUTION) ×2
SOL .9 NS 3000ML IRR UROMATIC (IV SOLUTION) ×1 IMPLANT
SOL PREP PVP 2OZ (MISCELLANEOUS) ×3
SOLUTION PREP PVP 2OZ (MISCELLANEOUS) ×1 IMPLANT
SPONGE DRAIN TRACH 4X4 STRL 2S (GAUZE/BANDAGES/DRESSINGS) ×3 IMPLANT
STAPLER SKIN PROX 35W (STAPLE) ×3 IMPLANT
STOCKINETTE IMPERV 14X48 (MISCELLANEOUS) ×2 IMPLANT
STRAP TIBIA SHORT (MISCELLANEOUS) ×3 IMPLANT
SUCTION FRAZIER HANDLE 10FR (MISCELLANEOUS) ×2
SUCTION TUBE FRAZIER 10FR DISP (MISCELLANEOUS) ×1 IMPLANT
SUT VIC AB 0 CT1 36 (SUTURE) ×5 IMPLANT
SUT VIC AB 1 CT1 36 (SUTURE) ×6 IMPLANT
SUT VIC AB 2-0 CT2 27 (SUTURE) ×3 IMPLANT
SYR 20ML LL LF (SYRINGE) ×3 IMPLANT
SYR 30ML LL (SYRINGE) ×6 IMPLANT
TIBIAL BASE ROT PLAT SZ 7 KNEE (Knees) ×3 IMPLANT
TIP FAN IRRIG PULSAVAC PLUS (DISPOSABLE) ×1 IMPLANT
TOWEL OR 17X26 4PK STRL BLUE (TOWEL DISPOSABLE) ×3 IMPLANT
TOWER CARTRIDGE SMART MIX (DISPOSABLE) ×3 IMPLANT
TRAY FOLEY MTR SLVR 16FR STAT (SET/KITS/TRAYS/PACK) ×3 IMPLANT
WRAPON POLAR PAD KNEE (MISCELLANEOUS)

## 2019-02-20 NOTE — Anesthesia Post-op Follow-up Note (Signed)
Anesthesia QCDR form completed.        

## 2019-02-20 NOTE — Anesthesia Procedure Notes (Addendum)
Spinal  Patient location during procedure: OR Start time: 02/20/2019 7:26 AM End time: 02/20/2019 7:29 AM Staffing Anesthesiologist: Alvin Critchley, MD Performed: anesthesiologist  Preanesthetic Checklist Completed: patient identified, site marked, surgical consent, pre-op evaluation, timeout performed, IV checked, risks and benefits discussed and monitors and equipment checked Spinal Block Patient position: sitting Prep: Betadine Patient monitoring: heart rate, continuous pulse ox, blood pressure and cardiac monitor Approach: right paramedian Location: L3-4 Injection technique: single-shot Needle Needle type: Whitacre and Introducer  Needle gauge: 24 G Needle length: 9 cm Additional Notes Negative paresthesia. Negative blood return. Positive free-flowing CSF. Expiration date of kit checked and confirmed. Patient tolerated procedure well, without complications.

## 2019-02-20 NOTE — Transfer of Care (Signed)
Immediate Anesthesia Transfer of Care Note  Patient: Derek Joseph  Procedure(s) Performed: COMPUTER ASSISTED TOTAL KNEE ARTHROPLASTY (Left Knee)  Patient Location: PACU  Anesthesia Type:General and Spinal  Level of Consciousness: awake, oriented and drowsy  Airway & Oxygen Therapy: Patient Spontanous Breathing and Patient connected to face mask oxygen  Post-op Assessment: Report given to RN, Post -op Vital signs reviewed and stable and Patient moving all extremities X 4  Post vital signs: Reviewed and stable  Last Vitals:  Vitals Value Taken Time  BP 115/47 02/20/19 1128  Temp 37.6 C 02/20/19 1128  Pulse 87 02/20/19 1130  Resp 19 02/20/19 1130  SpO2 95 % 02/20/19 1130  Vitals shown include unvalidated device data.  Last Pain:  Vitals:   02/20/19 1128  TempSrc:   PainSc: 0-No pain         Complications: No apparent anesthesia complications

## 2019-02-20 NOTE — Op Note (Signed)
OPERATIVE NOTE  DATE OF SURGERY:  02/20/2019  PATIENT NAME:  Derek Joseph   DOB: 1965-08-05  MRN: [id number]  PRE-OPERATIVE DIAGNOSIS: Degenerative arthrosis of the left knee, primary  POST-OPERATIVE DIAGNOSIS:  Same  PROCEDURE:  Left total knee arthroplasty using computer-assisted navigation  SURGEON:  Marciano Sequin. M.D.  ASSISTANT: Cassell Smiles, PA-C (present and scrubbed throughout the case, critical for assistance with exposure, retraction, instrumentation, and closure)  ANESTHESIA: spinal  ESTIMATED BLOOD LOSS: 50 mL  FLUIDS REPLACED: 1300 mL of crystalloid  TOURNIQUET TIME: 100 minutes  DRAINS: 2 medium Hemovac drains  SOFT TISSUE RELEASES: Anterior cruciate ligament, posterior cruciate ligament, deep medial collateral ligament, patellofemoral ligament  IMPLANTS UTILIZED: DePuy Attune size 7 posterior stabilized femoral component (cemented), size 7 rotating platform tibial component (cemented), 41 mm medialized dome patella (cemented), and a 5 mm stabilized rotating platform polyethylene insert.  INDICATIONS FOR SURGERY: Derek Joseph is a 53 y.o. year old male with a long history of progressive knee pain. X-rays demonstrated severe degenerative changes in tricompartmental fashion. The patient had not seen any significant improvement despite conservative nonsurgical intervention. After discussion of the risks and benefits of surgical intervention, the patient expressed understanding of the risks benefits and agree with plans for total knee arthroplasty.   The risks, benefits, and alternatives were discussed at length including but not limited to the risks of infection, bleeding, nerve injury, stiffness, blood clots, the need for revision surgery, cardiopulmonary complications, among others, and they were willing to proceed.  PROCEDURE IN DETAIL: The patient was brought into the operating room and, after adequate spinal anesthesia was achieved, a tourniquet was placed  on the patient's upper thigh. The patient's knee and leg were cleaned and prepped with alcohol and DuraPrep and draped in the usual sterile fashion. A "timeout" was performed as per usual protocol. The lower extremity was exsanguinated using an Esmarch, and the tourniquet was inflated to 300 mmHg. An anterior longitudinal incision was made followed by a standard mid vastus approach. The deep fibers of the medial collateral ligament were elevated in a subperiosteal fashion off of the medial flare of the tibia so as to maintain a continuous soft tissue sleeve. The patella was subluxed laterally and the patellofemoral ligament was incised. Inspection of the knee demonstrated severe degenerative changes with full-thickness loss of articular cartilage. Osteophytes were debrided using a rongeur. Anterior and posterior cruciate ligaments were excised. Two 4.0 mm Schanz pins were inserted in the femur and into the tibia for attachment of the array of trackers used for computer-assisted navigation. Hip center was identified using a circumduction technique. Distal landmarks were mapped using the computer. The distal femur and proximal tibia were mapped using the computer. The distal femoral cutting guide was positioned using computer-assisted navigation so as to achieve a 5 distal valgus cut. The femur was sized and it was felt that a size 7 femoral component was appropriate. A size 7 femoral cutting guide was positioned and the anterior cut was performed and verified using the computer. This was followed by completion of the posterior and chamfer cuts. Femoral cutting guide for the central box was then positioned in the center box cut was performed.  Attention was then directed to the proximal tibia. Medial and lateral menisci were excised. The extramedullary tibial cutting guide was positioned using computer-assisted navigation so as to achieve a 0 varus-valgus alignment and 3 posterior slope. The cut was performed and  verified using the computer. The proximal tibia was  sized and it was felt that a size 7 tibial tray was appropriate. Tibial and femoral trials were inserted followed by insertion of a 5 mm polyethylene insert. This allowed for excellent mediolateral soft tissue balancing both in flexion and in full extension. Finally, the patella was cut and prepared so as to accommodate a 41 mm medialized dome patella. A patella trial was placed and the knee was placed through a range of motion with excellent patellar tracking appreciated. The femoral trial was removed after debridement of posterior osteophytes. The central post-hole for the tibial component was reamed followed by insertion of a keel punch. Tibial trials were then removed. Cut surfaces of bone were irrigated with copious amounts of normal saline with antibiotic solution using pulsatile lavage and then suctioned dry. Polymethylmethacrylate cement with gentamicin was prepared in the usual fashion using a vacuum mixer. Cement was applied to the cut surface of the proximal tibia as well as along the undersurface of a size 7 rotating platform tibial component. Tibial component was positioned and impacted into place. Excess cement was removed using [employer] fast streamer. Cement was then applied to the cut surfaces of the femur as well as along the posterior flanges of the size 7 femoral component. The femoral component was positioned and impacted into place. Excess cement was removed using [employer] fast streamer. A 5 mm polyethylene trial was inserted and the knee was brought into full extension with steady axial compression applied. Finally, cement was applied to the backside of a 41 mm medialized dome patella and the patellar component was positioned and patellar clamp applied. Excess cement was removed using [employer] fast streamer. After adequate curing of the cement, the tourniquet was deflated after a total tourniquet time of 100 minutes. Hemostasis was achieved using electrocautery.  The knee was irrigated with copious amounts of normal saline with antibiotic solution using pulsatile lavage and then suctioned dry. 20 mL of 1.3% Exparel and 60 mL of 0.25% Marcaine in 40 mL of normal saline was injected along the posterior capsule, medial and lateral gutters, and along the arthrotomy site. A 5 mm stabilized rotating platform polyethylene insert was inserted and the knee was placed through a range of motion with excellent mediolateral soft tissue balancing appreciated and excellent patellar tracking noted. 2 medium drains were placed in the wound bed and brought out through separate stab incisions. The medial parapatellar portion of the incision was reapproximated using interrupted sutures of #1 Vicryl. Subcutaneous tissue was approximated in layers using first #0 Vicryl followed #2-0 Vicryl. The skin was approximated with skin staples. A sterile dressing was applied.  The patient tolerated the procedure well and was transported to the recovery room in stable condition.    Derek Joseph., M.D.

## 2019-02-20 NOTE — Anesthesia Preprocedure Evaluation (Signed)
Anesthesia Evaluation  Patient identified by MRN, date of birth, ID band Patient awake    Reviewed: Allergy & Precautions, NPO status , Patient's Chart, lab work & pertinent test results  Airway Mallampati: II  TM Distance: >3 FB     Dental  (+) Chipped   Pulmonary Patient abstained from smoking., former smoker,    Pulmonary exam normal        Cardiovascular negative cardio ROS Normal cardiovascular exam     Neuro/Psych negative neurological ROS  negative psych ROS   GI/Hepatic negative GI ROS, Neg liver ROS,   Endo/Other  Morbid obesity  Renal/GU negative Renal ROS  negative genitourinary   Musculoskeletal  (+) Arthritis , Osteoarthritis,    Abdominal Normal abdominal exam  (+)   Peds negative pediatric ROS (+)  Hematology negative hematology ROS (+)   Anesthesia Other Findings   Reproductive/Obstetrics                             Anesthesia Physical Anesthesia Plan  ASA: II  Anesthesia Plan: Spinal   Post-op Pain Management:    Induction: Intravenous  PONV Risk Score and Plan: Propofol infusion  Airway Management Planned: Nasal Cannula  Additional Equipment:   Intra-op Plan:   Post-operative Plan:   Informed Consent: I have reviewed the patients History and Physical, chart, labs and discussed the procedure including the risks, benefits and alternatives for the proposed anesthesia with the patient or authorized representative who has indicated his/her understanding and acceptance.     Dental advisory given  Plan Discussed with: CRNA and Surgeon  Anesthesia Plan Comments:         Anesthesia Quick Evaluation

## 2019-02-20 NOTE — Evaluation (Signed)
Physical Therapy Evaluation Patient Details Name: Derek Joseph MRN: [id number] DOB: 1965-06-20 Today's Date: 02/20/2019   History of Present Illness  Pt is a 53 yo male diagnosed with degenerative arthrosis of the L knee and is s/p elective L TKA.  PMH includes OA and morbid obesity.    Clinical Impression  Pt presented with deficits in strength, transfers, mobility, gait, balance, L knee ROM, and activity tolerance but overall performed well during the session.  Pt was Mod Ind with bed mobility tasks and was able to stand from an elevated EOB with only CGA and cues for sequencing.  Pt ambulated several feet with antalgic, step-to pattern but was steady without LOB or L knee buckling.  Pt will benefit from HHPT services upon discharge to safely address above deficits for decreased caregiver assistance and eventual return to PLOF.      Follow Up Recommendations Home health PT    Equipment Recommendations  Rolling walker with 5" wheels;3in1 (PT)    Recommendations for Other Services       Precautions / Restrictions Precautions Precautions: Fall Precaution Comments: Pt able to perform Ind LLE SLR without extensor lag, no KI required Restrictions Weight Bearing Restrictions: Yes LLE Weight Bearing: Weight bearing as tolerated      Mobility  Bed Mobility Overal bed mobility: Modified Independent             General bed mobility comments: Extra time and effort and use of bed rail required but no physical assistance needed  Transfers Overall transfer level: Needs assistance Equipment used: Rolling walker (2 wheeled) Transfers: Sit to/from Stand Sit to Stand: Min guard;From elevated surface         General transfer comment: Mod verbal cues for proper sequencing with pt demonstrating good eccentric and concentric control  Ambulation/Gait Ambulation/Gait assistance: Min guard Gait Distance (Feet): 4 Feet Assistive device: Rolling walker (2 wheeled) Gait  Pattern/deviations: Step-to pattern;Antalgic;Decreased step length - right;Decreased stance time - left Gait velocity: decreased   General Gait Details: Antalgic, step-to pattern with limited amb this session but steady without LOB or L knee buckling noted  Stairs            Wheelchair Mobility    Modified Rankin (Stroke Patients Only)       Balance Overall balance assessment: Needs assistance   Sitting balance-Leahy Scale: Normal     Standing balance support: Bilateral upper extremity supported Standing balance-Leahy Scale: Good                               Pertinent Vitals/Pain Pain Assessment: 0-10 Pain Score: 4  Pain Location: L knee Pain Descriptors / Indicators: Aching;Sore Pain Intervention(s): Premedicated before session;Monitored during session    [place] expects to be discharged to:: Private residence Living Arrangements: Spouse/significant other Available Help at Discharge: Family;Available 24 hours/day Type of Home: House Home Access: Stairs to enter Entrance Stairs-Rails: Left;Right(Too wide for both) Entrance Stairs-Number of Steps: 5 Home Layout: One level Home Equipment: Crutches      Prior Function Level of Independence: Independent         Comments: Ind amb community distances without AD, walks and up/down stairs frequently while working FT, no fall history, Ind with ADLs     Hand Dominance        Extremity/Trunk Assessment   Upper Extremity Assessment Upper Extremity Assessment: Overall WFL for tasks assessed    Lower Extremity Assessment Lower  Extremity Assessment: Generalized weakness;LLE deficits/detail LLE Deficits / Details: L hip flex >/= 3/5 with pt able to perform Ind LLE SLR without extensor lag, B ankle DF/PF AROM WFL LLE: Unable to fully assess due to pain LLE Sensation: WNL LLE Coordination: WNL       Communication   Communication: No difficulties  Cognition Arousal/Alertness:  Awake/alert Behavior During Therapy: WFL for tasks assessed/performed Overall Cognitive Status: Within Functional Limits for tasks assessed                                        General Comments      Exercises Total Joint Exercises Ankle Circles/Pumps: AROM;Both;10 reps Quad Sets: AROM;Strengthening;Both;10 reps Gluteal Sets: Strengthening;Both;10 reps Straight Leg Raises: AROM;Both;10 reps Long Arc Quad: AROM;Strengthening;Both;5 reps;10 reps Knee Flexion: AROM;Strengthening;Both;5 reps;10 reps Goniometric ROM: L knee AROM: 0-65 deg limited by wrapping Marching in Standing: AROM;Both;5 reps Other Exercises Other Exercises: HEP education and review per handout Other Exercises: Positioning education to encourage L knee ext PROM   Assessment/Plan    PT Assessment Patient needs continued PT services  PT Problem List Decreased strength;Decreased range of motion;Decreased activity tolerance;Decreased balance;Decreased mobility;Decreased knowledge of use of DME       PT Treatment Interventions DME instruction;Gait training;Stair training;Functional mobility training;Therapeutic activities;Therapeutic exercise;Balance training;Patient/family education    PT Goals (Current goals can be found in the Care Plan section)  Acute Rehab PT Goals Patient Stated Goal: To be able to climb stairs PT Goal Formulation: With patient Time For Goal Achievement: 03/05/19 Potential to Achieve Goals: Good    Frequency BID   Barriers to discharge        Co-evaluation               AM-PAC PT "6 Clicks" Mobility  Outcome Measure Help needed turning from your back to your side while in a flat bed without using bedrails?: A Little Help needed moving from lying on your back to sitting on the side of a flat bed without using bedrails?: A Little Help needed moving to and from a bed to a chair (including a wheelchair)?: A Little Help needed standing up from a chair using your  arms (e.g., wheelchair or bedside chair)?: A Little Help needed to walk in hospital room?: A Little Help needed climbing 3-5 steps with a railing? : A Little 6 Click Score: 18    End of Session Equipment Utilized During Treatment: Gait belt Activity Tolerance: Patient tolerated treatment well Patient left: in chair;with chair alarm set;with call bell/phone within reach;with family/visitor present;with SCD's reapplied;Other (comment)(Polar care to L knee)   PT Visit Diagnosis: Muscle weakness (generalized) (M62.81);Other abnormalities of gait and mobility (R26.89)    Time: [id number] PT Time Calculation (min) (ACUTE ONLY): 50 min   Charges:   PT Evaluation $PT Eval Moderate Complexity: 1 Mod PT Treatments $Therapeutic Exercise: 8-22 mins $Therapeutic Activity: 8-22 mins        D. Royetta Asal PT, DPT 02/20/19, 5:23 PM

## 2019-02-20 NOTE — Progress Notes (Signed)
Pt. Transported to room 156 and report given to RN. PT. Awake and pain controlled. Pt. VSS and in NAD. IV patent and infusing. Iceman cooling device attached prior to departure.

## 2019-02-20 NOTE — Anesthesia Procedure Notes (Signed)
Spinal  Patient location during procedure: OR Start time: 02/20/2019 7:30 AM End time: 02/20/2019 7:33 AM Staffing Anesthesiologist: Alvin Critchley, MD Performed: anesthesiologist  Preanesthetic Checklist Completed: patient identified, site marked, surgical consent, pre-op evaluation, timeout performed, IV checked, risks and benefits discussed and monitors and equipment checked Spinal Block Patient position: sitting Prep: DuraPrep Patient monitoring: heart rate, cardiac monitor, continuous pulse ox and blood pressure Approach: midline Location: L3-4 Injection technique: single-shot Needle Needle type: Sprotte  Needle gauge: 25 G Needle length: 9 cm Assessment Sensory level: T4

## 2019-02-20 NOTE — H&P (Signed)
The patient has been re-examined, and the chart reviewed, and there have been no interval changes to the documented history and physical.    The risks, benefits, and alternatives have been discussed at length. The patient expressed understanding of the risks benefits and agreed with plans for surgical intervention.  Daimen Shovlin P. Angelina Neece, Jr. M.D.    

## 2019-02-21 ENCOUNTER — Encounter: Payer: Self-pay | Admitting: Orthopedic Surgery

## 2019-02-21 NOTE — TOC Progression Note (Signed)
Transition of Care [facility]) - Progression Note    Patient Details  Name: Derek Joseph MRN: [id number] Date of Birth: [birth year]  [facility]) CM/SW Contact  Su Hilt, RN Phone Number: 02/21/2019, 9:29 AM  Clinical Narrative:    Requested the price of Lovenox        Expected Discharge Plan and Services                                                 Social Determinants of Health (SDOH) Interventions    Readmission Risk Interventions No flowsheet data found.

## 2019-02-21 NOTE — Progress Notes (Signed)
Physical Therapy Treatment Patient Details Name: Derek Joseph MRN: [id number] DOB: October 07, 1965 Today's Date: 02/21/2019    History of Present Illness Pt is a 53 yo male diagnosed with degenerative arthrosis of the L knee and is s/p elective L TKA.  PMH includes OA and morbid obesity.    PT Comments    Pt presented with deficits in strength, transfers, mobility, gait, balance, L knee ROM, and activity tolerance but is progressing well towards goals.  Pt demonstrated good effort, control, and stability with bed mobility and transfers and amb distance increased to 40' this session.  Pt's gait remained antalgic with step-to pattern but was steady without LOB or L knee buckling.  Pt will benefit from HHPT services upon discharge to safely address above deficits for decreased caregiver assistance and eventual return to PLOF.     Follow Up Recommendations  Home health PT     Equipment Recommendations  Rolling walker with 5" wheels;3in1 (PT)    Recommendations for Other Services       Precautions / Restrictions Precautions Precautions: Fall Precaution Comments: Pt able to perform Ind LLE SLR without extensor lag, no KI required Restrictions Weight Bearing Restrictions: Yes LLE Weight Bearing: Weight bearing as tolerated    Mobility  Bed Mobility Overal bed mobility: Modified Independent             General bed mobility comments: Extra time and effort and use of bed rail required but no physical assistance needed  Transfers Overall transfer level: Needs assistance Equipment used: Rolling walker (2 wheeled) Transfers: Sit to/from Stand Sit to Stand: Min guard;From elevated surface         General transfer comment: Mod verbal cues for proper sequencing with pt demonstrating good eccentric and concentric control  Ambulation/Gait Ambulation/Gait assistance: Min guard Gait Distance (Feet): 40 Feet Assistive device: Rolling walker (2 wheeled) Gait Pattern/deviations:  Step-to pattern;Antalgic;Decreased step length - right;Decreased stance time - left Gait velocity: decreased   General Gait Details: Antalgic, step-to pattern but steady without LOB or L knee buckling noted   Stairs             Wheelchair Mobility    Modified Rankin (Stroke Patients Only)       Balance Overall balance assessment: Needs assistance   Sitting balance-Leahy Scale: Normal     Standing balance support: Bilateral upper extremity supported Standing balance-Leahy Scale: Good                              Cognition Arousal/Alertness: Awake/alert Behavior During Therapy: WFL for tasks assessed/performed Overall Cognitive Status: Within Functional Limits for tasks assessed                                        Exercises Total Joint Exercises Ankle Circles/Pumps: AROM;Both;10 reps Quad Sets: AROM;Strengthening;Both;10 reps Gluteal Sets: Strengthening;Both;10 reps Straight Leg Raises: AROM;Both;10 reps Long Arc Quad: AROM;Strengthening;Both;10 reps;15 reps Knee Flexion: AROM;Strengthening;Both;10 reps;15 reps Marching in Standing: AROM;Both;10 reps;Standing;Seated Other Exercises Other Exercises: HEP education and review per handout Other Exercises: 90 deg L turn training x 4 to prevent CKC twisting on the L knee    General Comments        Pertinent Vitals/Pain Pain Assessment: 0-10 Pain Score: 4  Pain Location: Left knee pain Pain Descriptors / Indicators: Aching;Sore Pain Intervention(s): Premedicated before session;Monitored during session  Home Living Family/patient expects to be discharged to:: Private residence Living Arrangements: Spouse/significant other Available Help at Discharge: Family;Available 24 hours/day Type of Home: House Home Access: Stairs to enter Entrance Stairs-Rails: Left;Right Home Layout: One level Home Equipment: Crutches      Prior Function Level of Independence: Independent       Comments: Ind amb community distances without AD, walks and up/down stairs frequently while working FT, no fall history, Ind with ADLs   PT Goals (current goals can now be found in the care plan section) Acute Rehab PT Goals Patient Stated Goal: Independent Progress towards PT goals: Progressing toward goals    Frequency    BID      PT Plan Current plan remains appropriate    Co-evaluation              AM-PAC PT "6 Clicks" Mobility   Outcome Measure  Help needed turning from your back to your side while in a flat bed without using bedrails?: A Little Help needed moving from lying on your back to sitting on the side of a flat bed without using bedrails?: A Little Help needed moving to and from a bed to a chair (including a wheelchair)?: A Little Help needed standing up from a chair using your arms (e.g., wheelchair or bedside chair)?: A Little Help needed to walk in hospital room?: A Little Help needed climbing 3-5 steps with a railing? : A Little 6 Click Score: 18    End of Session Equipment Utilized During Treatment: Gait belt Activity Tolerance: Patient tolerated treatment well Patient left: in chair;with chair alarm set;with call bell/phone within reach;with family/visitor present;with SCD's reapplied;Other (comment)(Polar care to L knee) Nurse Communication: Mobility status PT Visit Diagnosis: Muscle weakness (generalized) (M62.81);Other abnormalities of gait and mobility (R26.89)     Time: [id number] PT Time Calculation (min) (ACUTE ONLY): 41 min  Charges:  $Gait Training: 8-22 mins $Therapeutic Exercise: 23-37 mins                     D. Scott Derek Joseph PT, DPT 02/21/19, 1:18 PM

## 2019-02-21 NOTE — Progress Notes (Signed)
Physical Therapy Treatment Patient Details Name: Derek Joseph MRN: [id number] DOB: Jun 24, 1965 Today's Date: 02/21/2019    History of Present Illness Pt is a 53 yo male diagnosed with degenerative arthrosis of the L knee and is s/p elective L TKA.  PMH includes OA and morbid obesity.    PT Comments    Pt presented with deficits in strength, transfers, mobility, gait, balance, L knee ROM, and activity tolerance but continued to make very good progress towards goals.  Pt was able to amb 2 x 125' with improved gait quality and cadence with no instability or L knee buckling.  Pt was able to ascend and descend 2 stairs with B rails x 2 and then with 1 rail x 2 with very good eccentric and concentric control.  Pt will benefit from HHPT services upon discharge to safely address above deficits for decreased caregiver assistance and eventual return to PLOF.     Follow Up Recommendations  Home health PT     Equipment Recommendations  Rolling walker with 5" wheels;3in1 (PT)    Recommendations for Other Services       Precautions / Restrictions Precautions Precautions: Fall Precaution Comments: Pt able to perform Ind LLE SLR without extensor lag, no KI required Restrictions Weight Bearing Restrictions: Yes LLE Weight Bearing: Weight bearing as tolerated    Mobility  Bed Mobility Overal bed mobility: Modified Independent             General bed mobility comments: Extra time and effort and use of bed rail required but no physical assistance needed  Transfers Overall transfer level: Needs assistance Equipment used: Rolling walker (2 wheeled) Transfers: Sit to/from Stand Sit to Stand: Supervision         General transfer comment: Min verbal cues for proper sequencing with pt demonstrating good eccentric and concentric control  Ambulation/Gait Ambulation/Gait assistance: Min guard Gait Distance (Feet): 125 Feet x 2 Assistive device: Rolling walker (2 wheeled) Gait  Pattern/deviations: Step-to pattern;Antalgic;Decreased step length - right;Decreased stance time - left;Step-through pattern Gait velocity: decreased   General Gait Details: Step-to pattern progressing to step-through pattern with improved cadence   Stairs Stairs: Yes   Stair Management: Two rails;One rail Left Number of Stairs: 2 General stair comments: Verbal and visual demonstration education on proper sequencing; Pt able to ascend/descend 2 steps x 2 with B rails and 2 steps x 2 with L rail with good control and stability   Wheelchair Mobility    Modified Rankin (Stroke Patients Only)       Balance Overall balance assessment: Needs assistance   Sitting balance-Leahy Scale: Normal     Standing balance support: Bilateral upper extremity supported;During functional activity Standing balance-Leahy Scale: Good                              Cognition Arousal/Alertness: Awake/alert Behavior During Therapy: WFL for tasks assessed/performed Overall Cognitive Status: Within Functional Limits for tasks assessed                                        Exercises Total Joint Exercises Ankle Circles/Pumps: AROM;Both;10 reps Quad Sets: AROM;Strengthening;Both;10 reps;5 reps Gluteal Sets: Strengthening;Both;10 reps Hip ABduction/ADduction: AROM;Both;5 reps Straight Leg Raises: AROM;Both;5 reps Long Arc Quad: AROM;Strengthening;Both;10 reps;15 reps Knee Flexion: AROM;Strengthening;Both;10 reps;15 reps Marching in Standing: AROM;Both;10 reps;Standing;Seated Other Exercises Other Exercises: HEP education  and review per handout Other Exercises: Car transfer verbal and demonstration training with pt and spouse using room chair to simulate car    General Comments        Pertinent Vitals/Pain Pain Assessment: 0-10 Pain Score: 3  Pain Location: Left knee pain Pain Descriptors / Indicators: Aching;Sore Pain Intervention(s): Premedicated before  session;Monitored during session    [place] expects to be discharged to:: Private residence Living Arrangements: Spouse/significant other Available Help at Discharge: Family;Available 24 hours/day Type of Home: House Home Access: Stairs to enter Entrance Stairs-Rails: Left;Right Home Layout: One level Home Equipment: Crutches      Prior Function Level of Independence: Independent      Comments: Ind amb community distances without AD, walks and up/down stairs frequently while working FT, no fall history, Ind with ADLs   PT Goals (current goals can now be found in the care plan section) Acute Rehab PT Goals Patient Stated Goal: Independent Progress towards PT goals: Progressing toward goals    Frequency    BID      PT Plan Current plan remains appropriate    Co-evaluation              AM-PAC PT "6 Clicks" Mobility   Outcome Measure  Help needed turning from your back to your side while in a flat bed without using bedrails?: A Little Help needed moving from lying on your back to sitting on the side of a flat bed without using bedrails?: A Little Help needed moving to and from a bed to a chair (including a wheelchair)?: A Little Help needed standing up from a chair using your arms (e.g., wheelchair or bedside chair)?: A Little Help needed to walk in hospital room?: A Little Help needed climbing 3-5 steps with a railing? : A Little 6 Click Score: 18    End of Session Equipment Utilized During Treatment: Gait belt Activity Tolerance: Patient tolerated treatment well Patient left: in bed;with call bell/phone within reach;with bed alarm set;with family/visitor present;with SCD's reapplied(Polar care donned to L knee) Nurse Communication: Mobility status PT Visit Diagnosis: Muscle weakness (generalized) (M62.81);Other abnormalities of gait and mobility (R26.89)     Time: [id number] PT Time Calculation (min) (ACUTE ONLY): 42 min  Charges:  $Gait  Training: 23-37 mins $Therapeutic Exercise: 8-22 mins                     D. Scott Zemirah Krasinski PT, DPT 02/21/19, 3:55 PM

## 2019-02-21 NOTE — TOC Benefit Eligibility Note (Signed)
Transition of Care [facility]) Benefit Eligibility Note    Patient Details  Name: Grabiel Finucan MRN: [id number] Date of Birth: 03-Aug-1965   Medication/Dose: Enoxaparin 40 mg daily x 14 days  Covered?: Yes  Tier: (Tier 1)  Prescription Coverage Preferred Pharmacy: Can use [facility] or [facility] in [place] with Person/Company/Phone Number:: Morey [employer], [employer], [phone]  Co-Pay: $14.78        Additional Notes: Lovenox is covered but the co-pay would be $369.79    Darius Bump Kla Bily Phone Number: 02/21/2019, 10:44 AM

## 2019-02-21 NOTE — Progress Notes (Signed)
D: Pt alert and oriented. Pain is controlled and managed well w/PRN meds. Pt OOB w/PT and voiding. Wife is at bedside.  A: Scheduled medications administered to pt, per MD orders. Support and encouragement provided. Frequent verbal contact made.   R: No adverse drug reactions noted. Pt complaint with medications and treatment plan. Pt interacts well with staff on the unit. Pt is stable at this time, will continue to monitor and provide care as ordered.

## 2019-02-21 NOTE — TOC Initial Note (Signed)
Transition of Care [facility]) - Initial/Assessment Note    Patient Details  Name: Derek Joseph MRN: [id number] Date of Birth: November 01, 1965  Transition of Care [facility]) CM/SW Contact:    Su Hilt, RN Phone Number: 02/21/2019, 10:44 AM  Clinical Narrative:                 Met with the patient to discuss DC plan and needs He lives at home with his wife He needs a RW and a BSC, I notified Brad with [employer] He would like to use [facility] for [facility], I notified Helene Kelp I changed his pharmacy in the system to [facility] at the patient's request his insurance no longer allows the pharmacy he was using  Will provide the cost of Lovenox once obtained  Expected Discharge Plan: [place] Barriers to Discharge: Continued Medical Work up   Patient Goals and CMS Choice Patient states their goals for this hospitalization and ongoing recovery are:: go home CMS Medicare.gov Compare Post Acute Care list provided to:: Patient Choice offered to / list presented to : Patient  Expected Discharge Plan and Services Expected Discharge Plan: [place]   Discharge Planning Services: CM Consult Post Acute Care Choice: [place] arrangements for the past 2 months: Single Family Home                 DME Arranged: 3-N-1, Walker rolling DME Agency: [employer] Date DME Agency Contacted: 02/21/19 Time DME Agency Contacted: 0300 Representative spoke with at DME Agency: [place]: PT [place]: [facility] ([facility] [employer]) Date [place]: 02/21/19 Time [place]: 37 Representative spoke with at [place] Arrangements/Services Living arrangements for the past 2 months: [place] with:: Spouse Patient language and need for interpreter reviewed:: Yes Do you feel safe going back to the place where you live?: Yes      Need for Family Participation in Patient Care: No (Comment) Care  giver support system in place?: Yes (comment)   Criminal Activity/Legal Involvement Pertinent to Current Situation/Hospitalization: No - Comment as needed  Activities of Daily Living Home Assistive Devices/Equipment: None ADL Screening (condition at time of admission) Patient's cognitive ability adequate to safely complete daily activities?: Yes Is the patient deaf or have difficulty hearing?: No Does the patient have difficulty seeing, even when wearing glasses/contacts?: No Does the patient have difficulty concentrating, remembering, or making decisions?: No Patient able to express need for assistance with ADLs?: Yes Does the patient have difficulty dressing or bathing?: No Independently performs ADLs?: Yes (appropriate for developmental age) Does the patient have difficulty walking or climbing stairs?: Yes Weakness of Legs: None Weakness of Arms/Hands: None  Permission Sought/Granted   Permission granted to share information with : Yes, Verbal Permission Granted              Emotional Assessment Appearance:: Appears stated age Attitude/Demeanor/Rapport: Engaged Affect (typically observed): Appropriate Orientation: : Oriented to Self, Oriented to Place, Oriented to  Time, Oriented to Situation Alcohol / Substance Use: Never Used Psych Involvement: No (comment)  Admission diagnosis:  PRIMARY OSTEOARTHRITIS OF LEFT KNEE. Patient Active Problem List   Diagnosis Date Noted  . Total knee replacement status 02/20/2019  . Umbilical hernia 92/33/0076  . Morbid obesity with BMI of 40.0-44.9, adult ([place]) 10/24/2018  . Primary osteoarthritis of left knee 10/24/2018  . Primary osteoarthritis of right ankle 10/24/2018   PCP:  Patient,  No Pcp Per Pharmacy:   [facility] [address] [place] [address] Phone: [phone] Fax: [phone]     Social Determinants of Health (SDOH) Interventions    Readmission Risk  Interventions No flowsheet data found.

## 2019-02-21 NOTE — Evaluation (Signed)
Occupational Therapy Evaluation Patient Details Name: Derek Joseph MRN: [id number] DOB: [birth year] Today's Date: 02/21/2019    History of Present Illness Pt. is a 53 y.o. male who was admitted to [facility] for a LTKA secondary to OA,   Clinical Impression   Pt. resides at home with his wife. Pt. was independent with ADLs, and IADL functioning: including meal preparation, and medication management. Pt. was able to drive and was working for [employer]. Pt. education was provided about A/E use for LE ADLs, Pt. education was provided about toilet Aides and BSCommodes. A visual handout was provided to the pt., and his wife. Pt. Could benefit from OT services for ADL training, continued A/E training, and pt. education about home modification, and DME. Pt. Plans to return home upon discharge with family to assist pt. as needed. No follow-up OT services are warranted at this time.    Follow Up Recommendations  No follow-up OT   Equipment Recommendations    Wide BSCommode   Recommendations for Other Services Rehab consult     Precautions / Restrictions Precautions Precautions: Fall Restrictions Weight Bearing Restrictions: Yes LLE Weight Bearing: Weight bearing as tolerated      Mobility Bed Mobility    Pt. Up in chair upon arrival              Transfers    deferred                  Balance                                           ADL either performed or assessed with clinical judgement   ADL Overall ADL's : Needs assistance/impaired Eating/Feeding: Set up;Independent   Grooming: Set up;Independent   Upper Body Bathing: Set up;Independent   Lower Body Bathing: Set up;Moderate assistance   Upper Body Dressing : Set up;Independent   Lower Body Dressing: Set up;Moderate assistance               Functional mobility during ADLs: Independent       Vision Baseline Vision/History: Wears glasses Wears Glasses: Reading only      Perception     Praxis      Pertinent Vitals/Pain Pain Assessment: 0-10 Pain Score: 4  Pain Location: Left knee pain Pain Descriptors / Indicators: Aching;Sore Pain Intervention(s): Premedicated before session;Monitored during session     Hand Dominance     Extremity/Trunk Assessment Upper Extremity Assessment Upper Extremity Assessment: Overall WFL for tasks assessed       Communication Communication Communication: No difficulties   Cognition Arousal/Alertness: Awake/alert Behavior During Therapy: WFL for tasks assessed/performed Overall Cognitive Status: Within Functional Limits for tasks assessed                                     General Comments       Exercises     Shoulder Instructions      Home Living Family/patient expects to be discharged to:: Private residence Living Arrangements: Spouse/significant other Available Help at Discharge: Family;Available 24 hours/day Type of Home: House Home Access: Stairs to enter [employer] of Steps: 5 Entrance Stairs-Rails: Left;Right Home Layout: One level               Home Equipment: Crutches  Prior Functioning/Environment Level of Independence: Independent        Comments: Ind amb community distances without AD, walks and up/down stairs frequently while working FT, no fall history, Ind with ADLs        OT Problem List: Decreased strength;Pain;Impaired UE functional use;Decreased coordination;Decreased activity tolerance      OT Treatment/Interventions: Self-care/ADL training;Therapeutic exercise;Patient/family education;DME and/or AE instruction    OT Goals(Current goals can be found in the care plan section) Acute Rehab OT Goals Patient Stated Goal: Independent OT Goal Formulation: With patient Potential to Achieve Goals: Good  OT Frequency: Min 2X/week   Barriers to D/C:            Co-evaluation              AM-PAC OT "6 Clicks" Daily  Activity     Outcome Measure Help from another person eating meals?: None Help from another person taking care of personal grooming?: None Help from another person toileting, which includes using toliet, bedpan, or urinal?: A Little Help from another person bathing (including washing, rinsing, drying)?: A Lot Help from another person to put on and taking off regular upper body clothing?: None Help from another person to put on and taking off regular lower body clothing?: A Lot 6 Click Score: 19   End of Session Equipment Utilized During Treatment: Gait belt  Activity Tolerance: Patient tolerated treatment well Patient left: in bed  OT Visit Diagnosis: Muscle weakness (generalized) (M62.81)                Time: [id number] OT Time Calculation (min): 30 min Charges:  OT Evaluation $OT Eval Moderate Complexity: 1 Mod Harrel Carina, MS, OTR/L Harrel Carina 02/21/2019, 12:13 PM

## 2019-02-21 NOTE — Progress Notes (Signed)
ORTHOPAEDICS PROGRESS NOTE  PATIENT NAME: Derek Joseph DOB: 04/12/1965  MRN: [id number]  POD # 1: Left total knee arthroplasty  Subjective: The patient states that pain is under good control. He denies any nausea or vomiting. He tolerated physical therapy well after surgery yesterday.  Objective: Vital signs in last 24 hours: Temp:  [97.5 F (36.4 C)-99.7 F (37.6 C)] 97.8 F (36.6 C) (11/12 0430) Pulse Rate:  [58-94] 58 (11/12 0430) Resp:  [14-22] 17 (11/12 0430) BP: (104-149)/(47-86) 116/84 (11/12 0430) SpO2:  [95 %-99 %] 98 % (11/12 0430)  Intake/Output from previous day: 11/11 0701 - 11/12 0700 In: 5079.5 [P.O.:520; I.V.:2559.5; IV Piggyback:400] Out: 2300 [Urine:1750; Drains:500; Blood:50]  No results for input(s): WBC, HGB, HCT, PLT, K, CL, CO2, BUN, CREATININE, GLUCOSE, CALCIUM, LABPT, INR in the last 72 hours.  EXAM General: Well-developed well-nourished male seen in no apparent discomfort. Lungs: clear to auscultation Cardiac: normal rate and regular rhythm Abdomen: Soft, nontender, nondistended.  Active bowel sounds are present. Left lower extremity: Dressing is dry and intact.  Hemovac drain and Polar Care are intact and functioning.  The patient is able to perform an independent straight leg raise.  Homans test is negative. Neurologic: Sensory and motor function are grossly intact.  Assessment: Left total knee arthroplasty  Secondary diagnoses: Morbid obesity (BMI 43.16)  Plan: Today's goals were reviewed with the patient.  Continue with physical therapy and Occupational Therapy as per total knee arthroplasty rehab protocol. Plan is to go Home after hospital stay. DVT Prophylaxis - Lovenox, Foot Pumps and TED hose  Azarie Coriz P. Holley Bouche M.D.

## 2019-02-21 NOTE — Anesthesia Postprocedure Evaluation (Signed)
Anesthesia Post Note  Patient: Derek Joseph  Procedure(s) Performed: COMPUTER ASSISTED TOTAL KNEE ARTHROPLASTY (Left Knee)  Patient location during evaluation: Nursing Unit Anesthesia Type: Spinal Level of consciousness: awake and alert and oriented Pain management: pain level controlled Vital Signs Assessment: post-procedure vital signs reviewed and stable Respiratory status: nonlabored ventilation, respiratory function stable and spontaneous breathing Cardiovascular status: stable Postop Assessment: no backache, no headache, patient able to bend at knees, adequate PO intake, able to ambulate and no apparent nausea or vomiting Anesthetic complications: no     Last Vitals:  Vitals:   02/20/19 2339 02/21/19 0430  BP: 112/77 116/84  Pulse: 61 (!) 58  Resp: 15 17  Temp: 36.7 C 36.6 C  SpO2: 98% 98%    Last Pain:  Vitals:   02/21/19 0808  TempSrc:   PainSc: 4                  Lanora Manis

## 2019-02-22 MED ORDER — OXYCODONE HCL 5 MG PO TABS: 5.0000 mg | ORAL_TABLET | ORAL | 0 refills | Status: DC | PRN

## 2019-02-22 MED ORDER — ENOXAPARIN SODIUM 40 MG/0.4ML ~~LOC~~ SOLN: 40.0000 mg | SUBCUTANEOUS | 0 refills | Status: DC

## 2019-02-22 MED ORDER — CELECOXIB 200 MG PO CAPS: 200.0000 mg | ORAL_CAPSULE | Freq: Two times a day (BID) | ORAL | 0 refills | Status: DC

## 2019-02-22 NOTE — Progress Notes (Signed)
Physical Therapy Treatment Patient Details Name: Derek Joseph MRN: [id number] DOB: Mar 06, 1966 Today's Date: 02/22/2019    History of Present Illness Pt is a 53 yo male diagnosed with degenerative arthrosis of the L knee and is s/p elective L TKA.  PMH includes OA and morbid obesity.    PT Comments    Pt presented with min deficits in strength, gait, balance, L knee ROM, and activity tolerance but continues to make very good progress towards goals.  Pt was Mod Ind with both bed mobility and transfers with no cues needed for proper sequencing and demonstrated good control and stability throughout.  Pt was able to amb 2 x 125' with good stability with distance limited only by pain.  Pt ascended and descended 4 steps with one rail with good carryover on proper sequencing and with good eccentric and concentric control.  Pt will benefit from HHPT services upon discharge to safely address above deficits for decreased caregiver assistance and eventual return to PLOF.      Follow Up Recommendations  Home health PT     Equipment Recommendations  Rolling walker with 5" wheels;3in1 (PT)    Recommendations for Other Services       Precautions / Restrictions Precautions Precautions: Fall Precaution Comments: Pt able to perform Ind LLE SLR without extensor lag, no KI required Restrictions Weight Bearing Restrictions: Yes LLE Weight Bearing: Weight bearing as tolerated    Mobility  Bed Mobility Overal bed mobility: Modified Independent             General bed mobility comments: Extra time and effort and use of bed rail required but no physical assistance needed  Transfers Overall transfer level: Modified independent Equipment used: Rolling walker (2 wheeled) Transfers: Sit to/from Stand Sit to Stand: Modified independent (Device/Increase time)         General transfer comment: No verbal cues needed for proper sequencing with pt demonstrating good eccentric and concentric  control  Ambulation/Gait Ambulation/Gait assistance: Supervision Gait Distance (Feet): 125 Feet x 2 Assistive device: Rolling walker (2 wheeled) Gait Pattern/deviations: Step-to pattern;Antalgic;Decreased step length - right;Decreased stance time - left;Step-through pattern Gait velocity: decreased   General Gait Details: Step-to pattern progressing to step-through pattern during the session with good stability and no L knee buckling noted   Stairs Stairs: Yes Stairs assistance: Supervision Stair Management: One rail Left Number of Stairs: 4 General stair comments: Ascend/descend 4 steps with one rail with good eccentric and concentric control   Wheelchair Mobility    Modified Rankin (Stroke Patients Only)       Balance Overall balance assessment: Needs assistance   Sitting balance-Leahy Scale: Normal     Standing balance support: Bilateral upper extremity supported;During functional activity Standing balance-Leahy Scale: Good Standing balance comment: Min lean on the RW with gait                            Cognition Arousal/Alertness: Awake/alert Behavior During Therapy: WFL for tasks assessed/performed Overall Cognitive Status: Within Functional Limits for tasks assessed                                        Exercises Total Joint Exercises Ankle Circles/Pumps: AROM;Both;10 reps Quad Sets: AROM;Strengthening;10 reps;Left;15 reps Hip ABduction/ADduction: AROM;Both;5 reps Straight Leg Raises: AROM;Both;5 reps Long Arc Quad: AROM;Strengthening;Both;10 reps;15 reps Knee Flexion: AROM;Strengthening;Both;10 reps;15 reps  Goniometric ROM: L knee AROM: 0-91 deg Marching in Standing: AROM;Both;10 reps;Standing    General Comments        Pertinent Vitals/Pain Pain Assessment: 0-10 Pain Score: 3  Pain Descriptors / Indicators: Aching;Sore Pain Intervention(s): Premedicated before session;Monitored during session    Home Living                       Prior Function            PT Goals (current goals can now be found in the care plan section) Progress towards PT goals: Progressing toward goals    Frequency    BID      PT Plan Current plan remains appropriate    Co-evaluation              AM-PAC PT "6 Clicks" Mobility   Outcome Measure  Help needed turning from your back to your side while in a flat bed without using bedrails?: None Help needed moving from lying on your back to sitting on the side of a flat bed without using bedrails?: None Help needed moving to and from a bed to a chair (including a wheelchair)?: None Help needed standing up from a chair using your arms (e.g., wheelchair or bedside chair)?: None Help needed to walk in hospital room?: A Little Help needed climbing 3-5 steps with a railing? : A Little 6 Click Score: 22    End of Session Equipment Utilized During Treatment: Gait belt Activity Tolerance: Patient tolerated treatment well Patient left: in chair;with call bell/phone within reach;with chair alarm set;with SCD's reapplied;Other (comment)(polar care donned to L knee) Nurse Communication: Mobility status PT Visit Diagnosis: Muscle weakness (generalized) (M62.81);Other abnormalities of gait and mobility (R26.89)     Time: [id number] PT Time Calculation (min) (ACUTE ONLY): 38 min  Charges:  $Gait Training: 23-37 mins $Therapeutic Exercise: 8-22 mins                     D. Scott Koula Venier PT, DPT 02/22/19, 11:18 AM

## 2019-02-22 NOTE — Progress Notes (Signed)
Discharge instructions and prescriptions reviewed with patient and wife. Verbalization of understanding received. Patient's wife administered dose of Lovenox to right abdomen. Wife instructed on how to use Ice man. IV discontinued without difficulty.

## 2019-02-22 NOTE — TOC Transition Note (Signed)
Transition of Care [facility]) - CM/SW Discharge Note   Patient Details  Name: Jalani Chillis MRN: [id number] Date of Birth: 1966/03/01  Transition of Care [facility]) CM/SW Contact:  Su Hilt, RN Phone Number: 02/22/2019, 9:54 AM   Clinical Narrative:     Patient to DC home with his wife today, Lovenox price is $14.78 DME in the room provided by [employer], Patient [facility] with [facility]  Final next level of care: [place] Barriers to Discharge: Barriers Resolved   Patient Goals and CMS Choice Patient states their goals for this hospitalization and ongoing recovery are:: go home CMS Medicare.gov Compare Post Acute Care list provided to:: Patient Choice offered to / list presented to : Patient  Discharge Placement                       Discharge Plan and Services   Discharge Planning Services: CM Consult Post Acute Care Choice: Home Health          DME Arranged: 3-N-1, Walker rolling DME Agency: [facility] Date DME Agency Contacted: 02/21/19 Time DME Agency Contacted: [id number] Representative spoke with at DME Agency: [place]: PT [place]: [facility] [employer] Date [place]: 02/21/19 Time [place]: [id number] Representative spoke with at [place]: [address] ([place]) Interventions     Readmission Risk Interventions No flowsheet data found.

## 2019-02-22 NOTE — Discharge Summary (Signed)
Physician Discharge Summary  Patient ID: Derek Joseph MRN: [id number] DOB/AGE: December 12, 1965 53 y.o.  Admit date: 02/20/2019 Discharge date: 02/22/2019  Admission Diagnoses:  PRIMARY OSTEOARTHRITIS OF LEFT KNEE.  Surgeries:  Left total knee arthroplasty using computer-assisted navigation  SURGEON:  Marciano Sequin. M.D.  ASSISTANT: Cassell Smiles, PA-C (present and scrubbed throughout the case, critical for assistance with exposure, retraction, instrumentation, and closure)  ANESTHESIA: spinal  ESTIMATED BLOOD LOSS: 50 mL  FLUIDS REPLACED: 1300 mL of crystalloid  TOURNIQUET TIME: 100 minutes  DRAINS: 2 medium Hemovac drains  SOFT TISSUE RELEASES: Anterior cruciate ligament, posterior cruciate ligament, deep medial collateral ligament, patellofemoral ligament  IMPLANTS UTILIZED: DePuy Attune size 7 posterior stabilized femoral component (cemented), size 7 rotating platform tibial component (cemented), 41 mm medialized dome patella (cemented), and a 5 mm stabilized rotating platform polyethylene insert. Discharge Diagnoses: Patient Active Problem List   Diagnosis Date Noted  . Total knee replacement status 02/20/2019  . Umbilical hernia [id number]  . Morbid obesity with BMI of 40.0-44.9, adult ([place]) 10/24/2018  . Primary osteoarthritis of left knee 10/24/2018  . Primary osteoarthritis of right ankle 10/24/2018    Past Medical History:  Diagnosis Date  . Arthritis      Transfusion: n/a   Consultants (if any):   Discharged Condition: Improved  Hospital Course: Yisrael Englander is an 53 y.o. male who was admitted 02/20/2019 with a diagnosis of left knee osteoarthritis and went to the operating room on 02/20/2019 and underwent left total knee arthroplasty. The patient received perioperative antibiotics for prophylaxis (see below). The patient tolerated the procedure well and was transported to PACU in stable condition. After meeting PACU criteria, the patient was  subsequently transferred to the Orthopaedics/Rehabilitation unit.   The patient received DVT prophylaxis in the form of early mobilization, Lovenox, Foot Pumps and TED hose. A sacral pad had been placed and heels were elevated off of the bed with rolled towels in order to protect skin integrity. Foley catheter was discontinued on postoperative day #1. Wound drains were discontinued on postoperative day #2. The surgical incision was healing well without signs of infection.  Physical therapy was initiated postoperatively for transfers, gait training, and strengthening. Occupational therapy was initiated for activities of daily living and evaluation for assisted devices. Rehabilitation goals were reviewed in detail with the patient. The patient made steady progress with physical therapy and physical therapy recommended discharge to Home.   The patient achieved his preliminary goals of this hospitalization and was felt to be medically and orthopaedically appropriate for discharge.  He was given perioperative antibiotics:  Anti-infectives (From admission, onward)   Start     Dose/Rate Route Frequency Ordered Stop   02/20/19 1600  ceFAZolin (ANCEF) IVPB 2g/100 mL premix     2 g 200 mL/hr over 30 Minutes Intravenous Every 6 hours 02/20/19 1527 02/21/19 1124   02/20/19 0615  ceFAZolin (ANCEF) IVPB 2g/100 mL premix     2 g 200 mL/hr over 30 Minutes Intravenous On call to O.R. 02/20/19 0600 02/20/19 0745   02/20/19 0615  ceFAZolin (ANCEF) 2-4 GM/100ML-% IVPB    Note to Pharmacy: Myles Lipps   : cabinet override      02/20/19 0615 02/20/19 0745    .  Recent vital signs:  Vitals:   02/22/19 0359 02/22/19 0756  BP: 121/78 (!) 150/87  Pulse: 67 68  Resp: 19 18  Temp: (!) 97.5 F (36.4 C) 98.7 F (37.1 C)  SpO2: 100% 100%  Recent laboratory studies:  No results for input(s): WBC, HGB, HCT, PLT, K, CL, CO2, BUN, CREATININE, GLUCOSE, CALCIUM, LABPT, INR in the last 72 hours.  Diagnostic  Studies: Dg Knee Left Port  Result Date: 02/20/2019 CLINICAL DATA:  Postop study. EXAM: PORTABLE LEFT KNEE - 1-2 VIEW COMPARISON:  None. FINDINGS: The patient is status post left knee replacement. Patient is status post left knee replacement. Hardware is in good position. IMPRESSION: Left knee replacement as above. Electronically Signed   By: Dorise Bullion III M.D   On: 02/20/2019 11:54    Discharge Medications:   Allergies as of 02/22/2019      Reactions   Codeine    Constipation: immediate and severe      Medication List    STOP taking these medications   ibuprofen 600 MG tablet Commonly known as: ADVIL   meloxicam 15 MG tablet Commonly known as: MOBIC     TAKE these medications   celecoxib 200 MG capsule Commonly known as: CELEBREX Take 1 capsule (200 mg total) by mouth 2 (two) times daily.   enoxaparin 40 MG/0.4ML injection Commonly known as: LOVENOX Inject 0.4 mLs (40 mg total) into the skin daily for 14 days.   oxyCODONE 5 MG immediate release tablet Commonly known as: Oxy IR/ROXICODONE Take 1 tablet (5 mg total) by mouth every 4 (four) hours as needed for moderate pain (pain score 4-6).            Durable Medical Equipment  (From admission, onward)         Start     Ordered   02/20/19 1528  DME Walker rolling  Once    Question:  Patient needs a walker to treat with the following condition  Answer:  Total knee replacement status   02/20/19 1527   02/20/19 1528  DME Bedside commode  Once    Question:  Patient needs a bedside commode to treat with the following condition  Answer:  Total knee replacement status   02/20/19 1527          Disposition: home with home health     Follow-up Information    Watt Climes, [place] On 03/06/2019.   Specialty: Physician Assistant Why: at 9:15am Contact information: [place] [address] [phone]        Dereck Leep, MD On 04/09/2019.   Specialty: Orthopedic Surgery Why: at  9:30am Contact information: [place] [address] [place], PA-C 02/22/2019, 8:12 AM

## 2019-02-22 NOTE — Progress Notes (Signed)
  Subjective: 2 Days Post-Op Procedure(s) (LRB): COMPUTER ASSISTED TOTAL KNEE ARTHROPLASTY (Left) Patient reports pain as well-controlled.   Patient is well, and has had no acute complaints or problems Plan is to go Home after hospital stay. Negative for chest pain and shortness of breath Fever: no Gastrointestinal: negative for nausea and vomiting.  Patient has not had a bowel movement.  Objective: Vital signs in last 24 hours: Temp:  [97.5 F (36.4 C)-98.7 F (37.1 C)] 98.7 F (37.1 C) (11/13 0756) Pulse Rate:  [65-70] 68 (11/13 0756) Resp:  [17-19] 18 (11/13 0756) BP: (115-150)/(78-96) 150/87 (11/13 0756) SpO2:  [99 %-100 %] 100 % (11/13 0756)  Intake/Output from previous day:  Intake/Output Summary (Last 24 hours) at 02/22/2019 0806 Last data filed at 02/22/2019 0555 Gross per 24 hour  Intake 1429.4 ml  Output 2355 ml  Net -925.6 ml    Intake/Output this shift: No intake/output data recorded.  Labs: No results for input(s): HGB in the last 72 hours. No results for input(s): WBC, RBC, HCT, PLT in the last 72 hours. No results for input(s): NA, K, CL, CO2, BUN, CREATININE, GLUCOSE, CALCIUM in the last 72 hours. No results for input(s): LABPT, INR in the last 72 hours.   EXAM General - Patient is Alert, Appropriate and Oriented Extremity - Neurovascular intact Dorsiflexion/Plantar flexion intact Compartment soft Dressing/Incision -Postoperative dressing remains in place., Polar Care in place and working. , Hemovac in place.  Motor Function - intact, moving foot and toes well on exam.  Able to perform straight leg raise. Cardiovascular- Regular rate and rhythm, no murmurs/rubs/gallops Respiratory- Lungs clear to auscultation bilaterally Gastrointestinal- soft, nontender and active bowel sounds   Assessment/Plan: 2 Days Post-Op Procedure(s) (LRB): COMPUTER ASSISTED TOTAL KNEE ARTHROPLASTY (Left) Active Problems:   Total knee replacement status  Estimated body  mass index is 43.16 kg/m as calculated from the following:   Height as of this encounter: 5\' 7"  (1.702 m).   Weight as of this encounter: 125 kg. Advance diet Up with therapy Discharge home with home health  Postoperative dressing removed.  Hemovac removed.  New honeycomb dressing placed.  DVT Prophylaxis - Lovenox, Ted hose and foot pumps Weight-Bearing as tolerated to left leg  Cassell Smiles, PA-C [facility] Orthopaedic Surgery 02/22/2019, 8:06 AM

## 2019-04-11 ENCOUNTER — Encounter: Payer: Self-pay | Admitting: *Deleted

## 2019-04-26 DIAGNOSIS — K635 Polyp of colon: Secondary | ICD-10-CM | POA: Insufficient documentation

## 2019-08-06 ENCOUNTER — Emergency Department
Admission: EM | Admit: 2019-08-06 | Discharge: 2019-08-06 | Disposition: A | Discharge status: Home / Self Care | Payer: Managed Care, Other (non HMO) | Attending: Student | Admitting: Student

## 2019-08-06 ENCOUNTER — Emergency Department: Payer: Managed Care, Other (non HMO)

## 2019-08-06 ENCOUNTER — Telehealth: Payer: Self-pay | Admitting: Adult Health

## 2019-08-06 ENCOUNTER — Encounter: Payer: Self-pay | Admitting: Emergency Medicine

## 2019-08-06 ENCOUNTER — Other Ambulatory Visit: Payer: Self-pay

## 2019-08-06 ENCOUNTER — Telehealth: Payer: Self-pay | Admitting: Infectious Diseases

## 2019-08-06 DIAGNOSIS — U071 COVID-19: Secondary | ICD-10-CM

## 2019-08-06 DIAGNOSIS — Z87891 Personal history of nicotine dependence: Secondary | ICD-10-CM | POA: Diagnosis not present

## 2019-08-06 DIAGNOSIS — R109 Unspecified abdominal pain: Secondary | ICD-10-CM

## 2019-08-06 DIAGNOSIS — Z79899 Other long term (current) drug therapy: Secondary | ICD-10-CM | POA: Insufficient documentation

## 2019-08-06 DIAGNOSIS — Z96652 Presence of left artificial knee joint: Secondary | ICD-10-CM | POA: Insufficient documentation

## 2019-08-06 DIAGNOSIS — R509 Fever, unspecified: Secondary | ICD-10-CM

## 2019-08-06 DIAGNOSIS — R112 Nausea with vomiting, unspecified: Secondary | ICD-10-CM

## 2019-08-06 DIAGNOSIS — R197 Diarrhea, unspecified: Secondary | ICD-10-CM

## 2019-08-06 HISTORY — DX: COVID-19: U07.1

## 2019-08-06 LAB — CBC
HCT: 47.6 % (ref 39.0–52.0)
Hemoglobin: 16.5 g/dL (ref 13.0–17.0)
MCH: 30.4 pg (ref 26.0–34.0)
MCHC: 34.7 g/dL (ref 30.0–36.0)
MCV: 87.8 fL (ref 80.0–100.0)
Platelets: UNDETERMINED 10*3/uL (ref 150–400)
RBC: 5.42 MIL/uL (ref 4.22–5.81)
RDW: 12 % (ref 11.5–15.5)
WBC: 5.2 10*3/uL (ref 4.0–10.5)
nRBC: 0 % (ref 0.0–0.2)

## 2019-08-06 LAB — URINALYSIS, COMPLETE (UACMP) WITH MICROSCOPIC
Bacteria, UA: NONE SEEN
Bilirubin Urine: NEGATIVE
Glucose, UA: NEGATIVE mg/dL
Ketones, ur: 20 mg/dL — AB
Leukocytes,Ua: NEGATIVE
Nitrite: NEGATIVE
Protein, ur: 100 mg/dL — AB
Specific Gravity, Urine: 1.027 (ref 1.005–1.030)
pH: 5 (ref 5.0–8.0)

## 2019-08-06 LAB — COMPREHENSIVE METABOLIC PANEL
ALT: 31 U/L (ref 0–44)
AST: 41 U/L (ref 15–41)
Albumin: 4 g/dL (ref 3.5–5.0)
Alkaline Phosphatase: 76 U/L (ref 38–126)
Anion gap: 12 (ref 5–15)
BUN: 15 mg/dL (ref 6–20)
CO2: 28 mmol/L (ref 22–32)
Calcium: 8.8 mg/dL — ABNORMAL LOW (ref 8.9–10.3)
Chloride: 95 mmol/L — ABNORMAL LOW (ref 98–111)
Creatinine, Ser: 1.05 mg/dL (ref 0.61–1.24)
GFR calc Af Amer: >60 mL/min (ref >60)
GFR calc non Af Amer: >60 mL/min (ref >60)
Glucose, Bld: 119 mg/dL — ABNORMAL HIGH (ref 70–99)
Potassium: 4 mmol/L (ref 3.5–5.1)
Sodium: 135 mmol/L (ref 135–145)
Total Bilirubin: 1 mg/dL (ref 0.3–1.2)
Total Protein: 8.1 g/dL (ref 6.5–8.1)

## 2019-08-06 LAB — RESPIRATORY PANEL BY RT PCR (FLU A&B, COVID)
Influenza A by PCR: NEGATIVE
Influenza B by PCR: NEGATIVE
SARS Coronavirus 2 by RT PCR: POSITIVE — AB

## 2019-08-06 LAB — LIPASE, BLOOD: Lipase: 39 U/L (ref 11–51)

## 2019-08-06 MED ORDER — AZITHROMYCIN 250 MG PO TABS: 250.0000 mg | ORAL_TABLET | Freq: Every day | ORAL | 0 refills | Status: AC

## 2019-08-06 MED ORDER — ACETAMINOPHEN 325 MG PO TABS
650.0000 mg | ORAL_TABLET | Freq: Once | ORAL | Status: AC | PRN
  Administered 2019-08-06: 06:00:00 650 mg via ORAL
  Filled 2019-08-06: qty 2

## 2019-08-06 MED ORDER — ONDANSETRON 4 MG PO TBDP
4.0000 mg | ORAL_TABLET | Freq: Once | ORAL | Status: AC | PRN
  Administered 2019-08-06: 06:00:00 4 mg via ORAL
  Filled 2019-08-06: qty 1

## 2019-08-06 MED ORDER — ONDANSETRON HCL 4 MG PO TABS: 4.0000 mg | ORAL_TABLET | Freq: Three times a day (TID) | ORAL | 0 refills | Status: AC | PRN

## 2019-08-06 MED ORDER — IOHEXOL 300 MG/ML  SOLN
100.0000 mL | Freq: Once | INTRAMUSCULAR | Status: AC | PRN
  Administered 2019-08-06: 100 mL via INTRAVENOUS

## 2019-08-06 MED ORDER — ONDANSETRON HCL 4 MG/2ML IJ SOLN
4.0000 mg | Freq: Once | INTRAMUSCULAR | Status: AC
  Administered 2019-08-06: 09:00:00 4 mg via INTRAVENOUS
  Filled 2019-08-06: qty 2

## 2019-08-06 MED ORDER — SODIUM CHLORIDE 0.9 % IV BOLUS
1000.0000 mL | Freq: Once | INTRAVENOUS | Status: AC
  Administered 2019-08-06: 09:00:00 1000 mL via INTRAVENOUS

## 2019-08-06 NOTE — ED Notes (Signed)
Date and time results received: 08/06/19 1136   Test: COVID Critical Value: Positive  Name of Provider Notified:  Dr. Joan Mayans

## 2019-08-06 NOTE — Telephone Encounter (Signed)
Called to discuss with patient about Covid symptoms and the use of bamlanivimab, a monoclonal antibody infusion for those with mild to moderate Covid symptoms and at a high risk of hospitalization.  Pt is qualified for this infusion at the [facility] infusion center due to BMI>35   Patient was seen at [facility] ER today to evaluate flank pain, fever, nausea, vomiting and diarrhea that started 4 days prior.   Patient Active Problem List   Diagnosis Date Noted  . Total knee replacement status 02/20/2019  . Umbilical hernia [id number]  . Morbid obesity with BMI of 40.0-44.9, adult ([place]) 10/24/2018  . Primary osteoarthritis of left knee 10/24/2018  . Primary osteoarthritis of right ankle 10/24/2018   LVM to call back to discuss - he would be a candidate for infusion and could get him scheduled 4/28.

## 2019-08-06 NOTE — Telephone Encounter (Signed)
Called and LMOM about letting him know that I am calling to discuss his ER visit, covid positivity, and treatment with monoclonal antibody therapy.  Call back number given so that we can discuss further.    Derek Bihari, NP

## 2019-08-06 NOTE — ED Triage Notes (Signed)
Pt presents to ED with right sided flank pain since Saturday with frequent vomiting and diarrhea. Worse today.

## 2019-08-06 NOTE — ED Notes (Signed)
Pt taken to CT.

## 2019-08-06 NOTE — Discharge Instructions (Signed)
Thank you for letting us take care of you in the emergency department.   Please continue to take any regularly prescribed medications.  We have written you a prescription for Zofran, antinausea medication.  We have written you prescription for azithromycin, antibiotic/anti-inflammatory.  Continue to take over the counter acetaminophen and ibuprofen as directed on the box to help with fevers as well as aches and pains.   Please return to the ER for any new or worsening symptoms, such as difficulty breathing, vomiting and diarrhea, or chest pain.   It is important that you practice safe hygiene habits, including social distancing/quarantining/mask wearing for the next 14 days.  We have referred you to the outpatient infusion clinic.  You will receive a phone call from them if you qualify for an outpatient infusion for COVID.

## 2019-08-06 NOTE — ED Notes (Signed)
ED Provider at bedside. 

## 2019-08-06 NOTE — ED Provider Notes (Signed)
[facility] Emergency Department Provider Note  ____________________________________________   First MD Initiated Contact with Patient 08/06/19 [phone]     (approximate)  I have reviewed the triage vital signs and the nursing notes.  History  Chief Complaint Flank Pain    HPI Derek Joseph is a 54 y.o. male past medical history as below who presents to the emergency department for fevers, nausea, vomiting, diarrhea, right-sided flank pain.  Symptoms started 4 days ago and have been constant, mildly worsening since onset.  He initially thought symptoms might be due to food poisoning after eating out, however symptoms persisted past 24 hours and have been worsening, which prompted him to seek care.  Complains of nausea, several episodes of vomiting, several episodes of loose stool.  Has also had a fever throughout this time period.  Reports associated right flank pain, aching in quality.  5/10 in severity.  No radiation.  No alleviating/aggravating components.  Denies any dysuria or hematuria.  No history of stones.  Has not been vaccinated against COVID.  Fever on arrival to 101 Fahrenheit.  Denies any chest pain, shortness of breath, or difficulty breathing.   Past Medical Hx Past Medical History:  Diagnosis Date  . Arthritis     Problem List Patient Active Problem List   Diagnosis Date Noted  . Total knee replacement status 02/20/2019  . Umbilical hernia [id number]  . Morbid obesity with BMI of 40.0-44.9, adult ([place]) 10/24/2018  . Primary osteoarthritis of left knee 10/24/2018  . Primary osteoarthritis of right ankle 10/24/2018    Past Surgical Hx Past Surgical History:  Procedure Laterality Date  . APPENDECTOMY  2000  . KNEE ARTHROPLASTY Left 02/20/2019   Procedure: COMPUTER ASSISTED TOTAL KNEE ARTHROPLASTY;  Surgeon: Dereck Leep, MD;  Location: [facility] ORS;  Service: Orthopedics;  Laterality: Left;  . TONSILLECTOMY     age 52     Medications Prior to Admission medications   Medication Sig Start Date End Date Taking? Authorizing Provider  celecoxib (CELEBREX) 200 MG capsule Take 1 capsule (200 mg total) by mouth 2 (two) times daily. 02/22/19   Tamala Julian B, PA-C  enoxaparin (LOVENOX) 40 MG/0.4ML injection Inject 0.4 mLs (40 mg total) into the skin daily for 14 days. 02/22/19 03/08/19  Tamala Julian B, PA-C  oxyCODONE (OXY IR/ROXICODONE) 5 MG immediate release tablet Take 1 tablet (5 mg total) by mouth every 4 (four) hours as needed for moderate pain (pain score 4-6). 02/22/19   Fausto Skillern, PA-C    Allergies Codeine  Family Hx No family history on file.  Social Hx Social History   Tobacco Use  . Smoking status: Former Smoker    Packs/day: 1.00    Years: 35.00    Pack years: 35.00    Quit date: 03/12/2012    Years since quitting: 7.4  . Smokeless tobacco: Never Used  Substance Use Topics  . Alcohol use: Yes    Comment: rare  . Drug use: Yes    Types: Marijuana    Comment: occ     Review of Systems  Constitutional: Positive for fever. Eyes: Negative for visual changes. ENT: Negative for sore throat. Cardiovascular: Negative for chest pain. Respiratory: Negative for shortness of breath. Gastrointestinal: Positive for right-sided flank pain, nausea, vomiting, diarrhea. Genitourinary: Negative for dysuria. Musculoskeletal: Negative for leg swelling. Skin: Negative for rash. Neurological: Negative for headaches.   Physical Exam  Vital Signs: ED Triage Vitals  Enc Vitals Group  BP 08/06/19 0519 134/81     Pulse Rate 08/06/19 0519 94     Resp 08/06/19 0519 20     Temp 08/06/19 0519 (!) 101 F (38.3 C)     Temp Source 08/06/19 0519 Oral     SpO2 08/06/19 0519 100 %     Weight 08/06/19 0522 279 lb (126.6 kg)     Height 08/06/19 0522 5\' 7"  (1.702 m)     Head Circumference --      Peak Flow --      Pain Score 08/06/19 0522 4     Pain Loc --      Pain Edu? --       Excl. in [place]? --     Constitutional: Alert and oriented. Well appearing. NAD.  Head: Normocephalic. Atraumatic. Eyes: Conjunctivae clear. Sclera anicteric. Pupils equal and symmetric. Nose: No masses or lesions. No congestion or rhinorrhea. Mouth/Throat: Wearing mask.  Neck: No stridor. Trachea midline.  Cardiovascular: Normal rate, regular rhythm. Extremities well perfused. Respiratory: Normal respiratory effort.  Lungs CTAB. Dry cough during exam. No hypoxia.  Gastrointestinal: Soft. Non-distended.  Abdomen nontender.  Mild right flank tenderness.  No rigidity, rebound, or guarding. Reducible umbilical hernia w/o overlying skin changes.  Genitourinary: Deferred. Musculoskeletal: No lower extremity edema. No deformities. Neurologic:  Normal speech and language. No gross focal or lateralizing neurologic deficits are appreciated.  Skin: Skin is warm, dry and intact. No rash noted. Psychiatric: Mood and affect are appropriate for situation.    Radiology  Personally reviewed available imaging myself.   CT A/P - IMPRESSION:  Patchy bilateral lower lobe ground-glass airspace opacities are  noted concerning for multifocal pneumonia, possibly COVID pneumonia.  Recommend clinical correlation.   Fatty infiltration of the liver.   Umbilical and inguinal hernias containing fat.   No acute findings in the abdomen or pelvis.    Procedures  Procedure(s) performed (including critical care):  Procedures   Initial Impression / Assessment and Plan / MDM / ED Course  54 y.o. male who presents to the ED for 4 days of fever, nausea, vomiting, diarrhea, flank pain  Ddx: colitis, gastroenteritis, pyelonephritis, ureterolithiasis, COVID-19  Will plan for labs, urine studies, IV fluids and symptom control, imaging  Clinical Course as of Aug 05 1212  Tue Aug 06, 2019  1210 CT scan negative for any acute intra-abdominal findings.  Base of lungs do reveal patchy multifocal pneumonia.  COVID  swab is positive, the likely etiology of his symptoms.  As he is hemodynamically stable, no evidence of respiratory distress, feel he is stable at this time for discharge. Advised supportive care, Rx for Zofran as needed, as well as Azithromycin (patient developing mild cough + tobacco use hx). Referred to outpatient infusion clinic. Discussed strict return precautions and proper social distancing/quarantine.    [SM]    Clinical Course User Index [SM] Lilia Pro., MD     _______________________________   As part of my medical decision making I have reviewed available labs, radiology tests, reviewed old records/performed chart review, obtained additional history from family (wife at bedside).  Final Clinical Impression(s) / ED Diagnosis  Final diagnoses:  Fever in adult  Right flank pain  Nausea, vomiting, and diarrhea  COVID-19       Note:  This document was prepared using Dragon voice recognition software and may include unintentional dictation errors.   Lilia Pro., MD 08/06/19 1215

## 2019-08-06 NOTE — ED Notes (Signed)
This RN went in to DC patient but pt still had about 372ml of NS left in IV bag. Will DC when fluids are finished.

## 2019-08-07 ENCOUNTER — Telehealth (INDEPENDENT_AMBULATORY_CARE_PROVIDER_SITE_OTHER): Payer: Self-pay | Admitting: Adult Health

## 2019-08-07 ENCOUNTER — Telehealth: Payer: Self-pay | Admitting: Adult Health

## 2019-08-07 NOTE — Telephone Encounter (Signed)
Called and LMOM about monoclonal antibody therapy for covid 19, asked that he call back.    Wilber Bihari, NP

## 2019-08-07 NOTE — Telephone Encounter (Signed)
Called to discuss with patient about Covid symptoms and the use of bamlanivimab, a monoclonal antibody infusion for those with mild to moderate Covid symptoms and at a high risk of hospitalization.  Pt is qualified for this infusion at the [facility] infusion center due to BMI>35   Patient was seen at [facility] ER 08/06/19 with complaints of  flank pain, fever, nausea, vomiting and diarrhea that started 5 days prior.   Unable to reach pt- left voicemail.  Unable to send [employer].  Mina Marble, NP-C

## 2019-08-08 ENCOUNTER — Telehealth (INDEPENDENT_AMBULATORY_CARE_PROVIDER_SITE_OTHER): Payer: Self-pay | Admitting: Adult Health

## 2019-08-08 NOTE — Telephone Encounter (Signed)
Called to discuss with patient about Covid symptoms and the use of bamlanivimab, a monoclonal antibody infusion for those with mild to moderate Covid symptoms and at a high risk of hospitalization. Pt is qualified for this infusion at the [facility] infusion center due to BMI>35  Unable to reach pt- left voicemail.  Unable to send [employer].  Mina Marble, NP-C

## 2019-08-09 ENCOUNTER — Encounter: Payer: Self-pay | Admitting: Physician Assistant

## 2019-10-17 ENCOUNTER — Other Ambulatory Visit
Admission: RE | Admit: 2019-10-17 | Discharge: 2019-10-17 | Disposition: A | Discharge status: Home / Self Care | Payer: Managed Care, Other (non HMO) | Source: Ambulatory Visit | Attending: Internal Medicine | Admitting: Internal Medicine

## 2019-10-17 NOTE — Pre-Procedure Instructions (Signed)
Patient arrived for covid testing for pre procedure. He tested positive for covid on 08/06/19 and does not need to be swabbed again since it has been less than 90 days since prior test.

## 2019-10-18 ENCOUNTER — Encounter: Payer: Self-pay | Admitting: Internal Medicine

## 2019-10-21 ENCOUNTER — Encounter
Admission: RE | Disposition: A | Discharge status: Home / Self Care | Payer: Self-pay | Source: Home / Self Care | Attending: Internal Medicine

## 2019-10-21 ENCOUNTER — Ambulatory Visit: Payer: Managed Care, Other (non HMO) | Admitting: Certified Registered"

## 2019-10-21 ENCOUNTER — Encounter: Payer: Self-pay | Admitting: Internal Medicine

## 2019-10-21 ENCOUNTER — Other Ambulatory Visit: Payer: Self-pay

## 2019-10-21 ENCOUNTER — Ambulatory Visit
Admission: RE | Admit: 2019-10-21 | Discharge: 2019-10-21 | Disposition: A | Discharge status: Home / Self Care | Payer: Managed Care, Other (non HMO) | Attending: Internal Medicine | Admitting: Internal Medicine

## 2019-10-21 DIAGNOSIS — Z791 Long term (current) use of non-steroidal anti-inflammatories (NSAID): Secondary | ICD-10-CM | POA: Diagnosis not present

## 2019-10-21 DIAGNOSIS — Z885 Allergy status to narcotic agent status: Secondary | ICD-10-CM | POA: Diagnosis not present

## 2019-10-21 DIAGNOSIS — Z8719 Personal history of other diseases of the digestive system: Secondary | ICD-10-CM | POA: Diagnosis not present

## 2019-10-21 DIAGNOSIS — Z6841 Body Mass Index (BMI) 40.0 and over, adult: Secondary | ICD-10-CM | POA: Insufficient documentation

## 2019-10-21 DIAGNOSIS — Z7982 Long term (current) use of aspirin: Secondary | ICD-10-CM | POA: Diagnosis not present

## 2019-10-21 DIAGNOSIS — D123 Benign neoplasm of transverse colon: Secondary | ICD-10-CM | POA: Insufficient documentation

## 2019-10-21 DIAGNOSIS — D125 Benign neoplasm of sigmoid colon: Secondary | ICD-10-CM | POA: Insufficient documentation

## 2019-10-21 DIAGNOSIS — K640 First degree hemorrhoids: Secondary | ICD-10-CM | POA: Insufficient documentation

## 2019-10-21 DIAGNOSIS — M1990 Unspecified osteoarthritis, unspecified site: Secondary | ICD-10-CM | POA: Diagnosis not present

## 2019-10-21 DIAGNOSIS — Z09 Encounter for follow-up examination after completed treatment for conditions other than malignant neoplasm: Secondary | ICD-10-CM | POA: Diagnosis present

## 2019-10-21 DIAGNOSIS — Z86010 Personal history of colonic polyps: Secondary | ICD-10-CM | POA: Insufficient documentation

## 2019-10-21 DIAGNOSIS — Z79899 Other long term (current) drug therapy: Secondary | ICD-10-CM | POA: Diagnosis not present

## 2019-10-21 HISTORY — DX: Unspecified osteoarthritis, unspecified site: M19.90

## 2019-10-21 HISTORY — PX: COLONOSCOPY WITH PROPOFOL: SHX5780

## 2019-10-21 SURGERY — COLONOSCOPY WITH PROPOFOL
Anesthesia: General

## 2019-10-21 MED ORDER — PROPOFOL 10 MG/ML IV BOLUS: INTRAVENOUS | Status: DC | PRN

## 2019-10-21 MED ORDER — GLYCOPYRROLATE 0.2 MG/ML IJ SOLN
INTRAMUSCULAR | Status: DC | PRN
  Administered 2019-10-21: .2 mg via INTRAVENOUS

## 2019-10-21 MED ORDER — MIDAZOLAM HCL 2 MG/2ML IJ SOLN
INTRAMUSCULAR | Status: DC | PRN
  Administered 2019-10-21: 2 mg via INTRAVENOUS

## 2019-10-21 MED ORDER — PROPOFOL 10 MG/ML IV BOLUS
INTRAVENOUS | Status: DC | PRN
  Administered 2019-10-21: 30 mg via INTRAVENOUS
  Administered 2019-10-21: 20 mg via INTRAVENOUS

## 2019-10-21 MED ORDER — GLYCOPYRROLATE 0.2 MG/ML IJ SOLN
INTRAMUSCULAR | Status: AC
  Filled 2019-10-21: qty 1

## 2019-10-21 MED ORDER — MIDAZOLAM HCL 2 MG/2ML IJ SOLN
INTRAMUSCULAR | Status: AC
  Filled 2019-10-21: qty 2

## 2019-10-21 MED ORDER — SODIUM CHLORIDE 0.9 % IV SOLN: INTRAVENOUS | Status: DC

## 2019-10-21 MED ORDER — LIDOCAINE HCL (CARDIAC) PF 100 MG/5ML IV SOSY
PREFILLED_SYRINGE | INTRAVENOUS | Status: DC | PRN
  Administered 2019-10-21: 100 mg via INTRAVENOUS

## 2019-10-21 MED ORDER — LIDOCAINE HCL (PF) 2 % IJ SOLN
INTRAMUSCULAR | Status: AC
  Filled 2019-10-21: qty 5

## 2019-10-21 MED ORDER — PROPOFOL 500 MG/50ML IV EMUL
INTRAVENOUS | Status: DC | PRN
  Administered 2019-10-21: 165 ug/kg/min via INTRAVENOUS

## 2019-10-21 NOTE — Transfer of Care (Signed)
Immediate Anesthesia Transfer of Care Note  Patient: Derek Joseph  Procedure(s) Performed: COLONOSCOPY WITH PROPOFOL (N/A )  Patient Location: Endoscopy Unit  Anesthesia Type:General  Level of Consciousness: drowsy and responds to stimulation  Airway & Oxygen Therapy: Patient Spontanous Breathing and Patient connected to face mask oxygen  Post-op Assessment: Report given to RN and Post -op Vital signs reviewed and stable  Post vital signs: Reviewed and stable  Last Vitals:  Vitals Value Taken Time  BP    Temp    Pulse 61 10/21/19 1457  Resp 13 10/21/19 1457  SpO2 100 % 10/21/19 1457  Vitals shown include unvalidated device data.  Last Pain:  Vitals:   10/21/19 1314  TempSrc: Temporal  PainSc: 0-No pain         Complications: No complications documented.

## 2019-10-21 NOTE — Anesthesia Procedure Notes (Signed)
Procedure Name: General with mask airway Performed by: Fletcher-Harrison, Zimri Brennen, CRNA Pre-anesthesia Checklist: Patient identified, Emergency Drugs available, Suction available and Patient being monitored Patient Re-evaluated:Patient Re-evaluated prior to induction Oxygen Delivery Method: Simple face mask Induction Type: IV induction Placement Confirmation: CO2 detector and positive ETCO2 Dental Injury: Teeth and Oropharynx as per pre-operative assessment        

## 2019-10-21 NOTE — Anesthesia Preprocedure Evaluation (Signed)
Anesthesia Evaluation  Patient identified by MRN, date of birth, ID band Patient awake    Reviewed: Allergy & Precautions, H&P , NPO status , Patient's Chart, lab work & pertinent test results, reviewed documented beta blocker date and time   History of Anesthesia Complications Negative for: history of anesthetic complications  Airway Mallampati: II  TM Distance: >3 FB Neck ROM: full    Dental  (+) Chipped, Dental Advidsory Given, Missing   Pulmonary neg pulmonary ROS, Patient abstained from smoking., former smoker,    Pulmonary exam normal breath sounds clear to auscultation       Cardiovascular Exercise Tolerance: Good negative cardio ROS Normal cardiovascular exam Rhythm:regular Rate:Normal     Neuro/Psych negative neurological ROS  negative psych ROS   GI/Hepatic negative GI ROS, Neg liver ROS,   Endo/Other  neg diabetesMorbid obesity  Renal/GU negative Renal ROS  negative genitourinary   Musculoskeletal  (+) Arthritis , Osteoarthritis,    Abdominal Normal abdominal exam  (+)   Peds negative pediatric ROS (+)  Hematology negative hematology ROS (+)   Anesthesia Other Findings Past Medical History: No date: Arthritis No date: Morbid obesity ([place]) No date: Osteoarthritis   Reproductive/Obstetrics negative OB ROS                             Anesthesia Physical  Anesthesia Plan  ASA: III  Anesthesia Plan: General   Post-op Pain Management:    Induction: Intravenous  PONV Risk Score and Plan: Propofol infusion and TIVA  Airway Management Planned: Nasal Cannula and Natural Airway  Additional Equipment:   Intra-op Plan:   Post-operative Plan:   Informed Consent: I have reviewed the patients History and Physical, chart, labs and discussed the procedure including the risks, benefits and alternatives for the proposed anesthesia with the patient or authorized representative  who has indicated his/her understanding and acceptance.     Dental advisory given  Plan Discussed with: CRNA and Surgeon  Anesthesia Plan Comments:         Anesthesia Quick Evaluation

## 2019-10-21 NOTE — Interval H&P Note (Signed)
History and Physical Interval Note:  10/21/2019 2:25 PM  Derek Joseph  has presented today for surgery, with the diagnosis of [place].  The various methods of treatment have been discussed with the patient and family. After consideration of risks, benefits and other options for treatment, the patient has consented to  Procedure(s) with comments: COLONOSCOPY WITH PROPOFOL (N/A) - Patient tested positive for COVID-19 on 08-06-19 (copy of results on chart and is in [facility]) as a surgical intervention.  The patient's history has been reviewed, patient examined, no change in status, stable for surgery.  I have reviewed the patient's chart and labs.  Questions were answered to the patient's satisfaction.     [place], [place]

## 2019-10-21 NOTE — H&P (Signed)
Outpatient short stay form Pre-procedure 10/21/2019 2:23 PM Korie Streat K. Alice Reichert, M.D.  Primary Physician: Toni Arthurs, NP  Reason for visit:  History of multiple adenomatous colon polyps in 2017 colonoscopy.  History of present illness:                           Patient presents for colonoscopy for a personal hx of colon polyps. The patient denies abdominal pain, abnormal weight loss or rectal bleeding.      Current Facility-Administered Medications:  .  0.9 %  sodium chloride infusion, , Intravenous, Continuous, Lluveras, Benay Pike, MD, Last Rate: 20 mL/hr at 10/21/19 1330, New Bag at 10/21/19 1330  Medications Prior to Admission  Medication Sig Dispense Refill Last Dose  . celecoxib (CELEBREX) 200 MG capsule Take 1 capsule (200 mg total) by mouth 2 (two) times daily. 84 capsule 0 Past Week at Unknown time  . lovastatin (MEVACOR) 10 MG tablet Take 10 mg by mouth daily.   Past Week at Unknown time  . acetaminophen (TYLENOL) 325 MG tablet Take by mouth.     Marland Kitchen aspirin 81 MG EC tablet Take by mouth. (Patient not taking: Reported on 10/21/2019)   Completed Course at Unknown time  . enoxaparin (LOVENOX) 40 MG/0.4ML injection Inject 0.4 mLs (40 mg total) into the skin daily for 14 days. 5.6 mL 0   . oxyCODONE (OXY IR/ROXICODONE) 5 MG immediate release tablet Take 1 tablet (5 mg total) by mouth every 4 (four) hours as needed for moderate pain (pain score 4-6). 30 tablet 0      Allergies  Allergen Reactions  . Codeine     Constipation: immediate and severe     Past Medical History:  Diagnosis Date  . Arthritis   . Morbid obesity ([place])   . Osteoarthritis     Review of systems:  Otherwise negative.    Physical Exam  Gen: Alert, oriented. Appears stated age.  HEENT: [place]/AT. PERRLA. Lungs: CTA, no wheezes. CV: RR nl S1, S2. Abd: soft, benign, no masses. BS+ Ext: No edema. Pulses 2+    Planned procedures: Proceed with colonoscopy. The patient understands the nature of the planned  procedure, indications, risks, alternatives and potential complications including but not limited to bleeding, infection, perforation, damage to internal organs and possible oversedation/side effects from anesthesia. The patient agrees and gives consent to proceed.  Please refer to procedure notes for findings, recommendations and patient disposition/instructions.     Ilayda Toda K. Alice Reichert, M.D. Gastroenterology 10/21/2019  2:23 PM

## 2019-10-21 NOTE — Op Note (Signed)
[facility] Gastroenterology Patient Name: Derek Joseph Procedure Date: 10/21/2019 2:34 PM MRN: [id number] Account #: [id number] Date of Birth: [birth year] Admit Type: Outpatient Age: 54 Room: [facility] ENDO ROOM 3 Gender: Male Note Status: Finalized Procedure:             Colonoscopy Indications:           High risk colon cancer surveillance: Personal history                         of multiple (3 or more) adenomas Providers:             Lorie Apley K. Alice Reichert MD, MD Referring MD:          Sofie Hartigan (Referring MD) Medicines:             Propofol per Anesthesia Complications:         No immediate complications. Procedure:             Pre-Anesthesia Assessment:                        - The risks and benefits of the procedure and the                         sedation options and risks were discussed with the                         patient. All questions were answered and informed                         consent was obtained.                        - Patient identification and proposed procedure were                         verified prior to the procedure by the nurse. The                         procedure was verified in the procedure room.                        - ASA Grade Assessment: III - A patient with severe                         systemic disease.                        - After reviewing the risks and benefits, the patient                         was deemed in satisfactory condition to undergo the                         procedure.                        After obtaining informed consent, the colonoscope was                         passed under direct vision.  Throughout the procedure,                         the patient's blood pressure, pulse, and oxygen                         saturations were monitored continuously. The                         Colonoscope was introduced through the anus and                         advanced to the the cecum, identified by  appendiceal                         orifice and ileocecal valve. The colonoscopy was                         performed without difficulty. The patient tolerated                         the procedure well. The quality of the bowel                         preparation was adequate. The ileocecal valve,                         appendiceal orifice, and rectum were photographed. Findings:      The perianal and digital rectal examinations were normal. Pertinent       negatives include normal sphincter tone and no palpable rectal lesions.      A few small-mouthed diverticula were found in the sigmoid colon. There       was no evidence of diverticular bleeding.      Four sessile polyps were found in the sigmoid colon, splenic flexure and       transverse colon. The polyps were 3 to 5 mm in size. These polyps were       removed with a jumbo cold forceps. Resection and retrieval were complete.      Non-bleeding internal hemorrhoids were found during retroflexion. The       hemorrhoids were Grade I (internal hemorrhoids that do not prolapse).      The exam was otherwise without abnormality. Impression:            - Mild diverticulosis in the sigmoid colon. There was                         no evidence of diverticular bleeding.                        - Four 3 to 5 mm polyps in the sigmoid colon, at the                         splenic flexure and in the transverse colon, removed                         with a jumbo cold forceps. Resected and retrieved.                        -  Non-bleeding internal hemorrhoids.                        - The examination was otherwise normal. Recommendation:        - Patient has a contact number available for                         emergencies. The signs and symptoms of potential                         delayed complications were discussed with the patient.                         Return to normal activities tomorrow. Written                         discharge  instructions were provided to the patient.                        - Resume previous diet.                        - Continue present medications.                        - Repeat colonoscopy is recommended for surveillance.                         The colonoscopy date will be determined after                         pathology results from today's exam become available                         for review.                        - Return to GI office PRN.                        - The findings and recommendations were discussed with                         the patient. Procedure Code(s):     --- Professional ---                        [phone], Colonoscopy, flexible; with biopsy, single or                         multiple Diagnosis Code(s):     --- Professional ---                        K57.30, Diverticulosis of large intestine without                         perforation or abscess without bleeding                        K63.5, Polyp of colon  K64.0, First degree hemorrhoids                        Z86.010, Personal history of colonic polyps CPT copyright 2019 American Medical Association. All rights reserved. The codes documented in this report are preliminary and upon coder review may  be revised to meet current compliance requirements. Efrain Sella MD, MD 10/21/2019 3:00:15 PM This report has been signed electronically. Number of Addenda: 0 Note Initiated On: 10/21/2019 2:34 PM Scope Withdrawal Time: 0 hours 8 minutes 48 seconds  Total Procedure Duration: 0 hours 10 minutes 36 seconds  Estimated Blood Loss:  Estimated blood loss: none.      [facility]

## 2019-10-22 ENCOUNTER — Encounter: Payer: Self-pay | Admitting: Internal Medicine

## 2019-10-22 NOTE — Anesthesia Postprocedure Evaluation (Signed)
Anesthesia Post Note  Patient: Derek Joseph  Procedure(s) Performed: COLONOSCOPY WITH PROPOFOL (N/A )  Patient location during evaluation: Endoscopy Anesthesia Type: General Level of consciousness: awake and alert Pain management: pain level controlled Vital Signs Assessment: post-procedure vital signs reviewed and stable Respiratory status: spontaneous breathing, nonlabored ventilation, respiratory function stable and patient connected to nasal cannula oxygen Cardiovascular status: blood pressure returned to baseline and stable Postop Assessment: no apparent nausea or vomiting Anesthetic complications: no   No complications documented.   Last Vitals:  Vitals:   10/21/19 1457 10/21/19 1507  BP: 97/75 112/71  Pulse:    Resp:    Temp: 36.8 C   SpO2:      Last Pain:  Vitals:   10/21/19 1517  TempSrc:   PainSc: 0-No pain                 Martha Clan

## 2019-10-23 LAB — SURGICAL PATHOLOGY

## 2021-05-03 NOTE — Discharge Instructions (Signed)
Instructions after Total Knee Replacement   Parks Czajkowski P. Harm Jou, Jr., M.D.     Dept. of Orthopaedics & Sports Medicine  [facility]  [address]  [place], [place]  [address]  Phone: [phone]   Fax: [phone]    DIET: Drink plenty of non-alcoholic fluids. Resume your normal diet. Include foods high in fiber.  ACTIVITY:  You may use crutches or a walker with weight-bearing as tolerated, unless instructed otherwise. You may be weaned off of the walker or crutches by your Physical Therapist.  Do NOT place pillows under the knee. Anything placed under the knee could limit your ability to straighten the knee.   Continue doing gentle exercises. Exercising will reduce the pain and swelling, increase motion, and prevent muscle weakness.   Please continue to use the TED compression stockings for 6 weeks. You may remove the stockings at night, but should reapply them in the morning. Do not drive or operate any equipment until instructed.  WOUND CARE:  Continue to use the PolarCare or ice packs periodically to reduce pain and swelling. You may bathe or shower after the staples are removed at the first office visit following surgery.  MEDICATIONS: You may resume your regular medications. Please take the pain medication as prescribed on the medication. Do not take pain medication on an empty stomach. You have been given a prescription for a blood thinner (Lovenox or Coumadin). Please take the medication as instructed. (NOTE: After completing a 2 week course of Lovenox, take one Enteric-coated aspirin once a day. This along with elevation will help reduce the possibility of phlebitis in your operated leg.) Do not drive or drink alcoholic beverages when taking pain medications.  CALL THE OFFICE FOR: Temperature above 101 degrees Excessive bleeding or drainage on the dressing. Excessive swelling, coldness, or paleness of the toes. Persistent nausea and vomiting.  FOLLOW-UP:  You  should have an appointment to return to the office in 10-14 days after surgery. Arrangements have been made for continuation of Physical Therapy (either home therapy or outpatient therapy).   [facility] Department Directory         [email or web address]       [email or web address]          Cardiology  Appointments: [place] - [phone] [place] - [phone]  Endocrinology  Appointments: [place] - [phone] [place] - [phone]  Gastroenterology  Appointments: [place] - [phone] [place] - [phone]        General Surgery   Appointments: [place] - [phone]  Internal Medicine/Family Medicine  Appointments: [place] - [phone] [place] - [phone] [place] - [phone]  Metabolic and Weigh Loss Surgery  Appointments: [place] - [phone]        Neurology  Appointments: [place] - [phone] [place] - [phone]  Neurosurgery  Appointments: [place] - [phone]  Obstetrics & Gynecology  Appointments: [address] - [phone] [place] - [phone]        Pediatrics  Appointments: [place] - [phone] [place] - [phone]  Physiatry  Appointments: [place] -[phone]  Physical Therapy  Appointments: [place] - [phone] [place] - [phone]        Podiatry  Appointments: [place] - [phone] [place] - [phone]  Pulmonology  Appointments: [place] - [phone]  Rheumatology  Appointments: [place] - [phone]        [place] Location: [facility]  [address] , [place]  [address]  [place] Location: [facility] [address] [place], [place]  [address]  [place] Location: [facility] [address] [place],   [address]    

## 2021-05-06 DIAGNOSIS — K5790 Diverticulosis of intestine, part unspecified, without perforation or abscess without bleeding: Secondary | ICD-10-CM | POA: Insufficient documentation

## 2021-05-07 ENCOUNTER — Other Ambulatory Visit: Payer: Self-pay

## 2021-05-07 ENCOUNTER — Encounter
Admission: RE | Admit: 2021-05-07 | Discharge: 2021-05-07 | Disposition: A | Discharge status: Home / Self Care | Payer: BC Managed Care – PPO | Source: Ambulatory Visit | Attending: Orthopedic Surgery | Admitting: Orthopedic Surgery

## 2021-05-07 VITALS — BP 125/86 | HR 61 | Resp 14 | Ht 67.0 in | Wt 276.0 lb

## 2021-05-07 DIAGNOSIS — Z01818 Encounter for other preprocedural examination: Secondary | ICD-10-CM | POA: Diagnosis present

## 2021-05-07 DIAGNOSIS — M1711 Unilateral primary osteoarthritis, right knee: Secondary | ICD-10-CM | POA: Insufficient documentation

## 2021-05-07 LAB — SURGICAL PCR SCREEN
MRSA, PCR: NEGATIVE
Staphylococcus aureus: NEGATIVE

## 2021-05-07 LAB — URINALYSIS, ROUTINE W REFLEX MICROSCOPIC
Bilirubin Urine: NEGATIVE
Glucose, UA: NEGATIVE mg/dL
Hgb urine dipstick: NEGATIVE
Ketones, ur: NEGATIVE mg/dL
Leukocytes,Ua: NEGATIVE
Nitrite: NEGATIVE
Protein, ur: NEGATIVE mg/dL
Specific Gravity, Urine: 1.01 (ref 1.005–1.030)
pH: 5.5 (ref 5.0–8.0)

## 2021-05-07 LAB — C-REACTIVE PROTEIN: CRP: 0.8 mg/dL (ref <1.0)

## 2021-05-07 LAB — TYPE AND SCREEN
ABO/RH(D): B POS
Antibody Screen: NEGATIVE

## 2021-05-07 LAB — SEDIMENTATION RATE: Sed Rate: 3 mm/hr (ref 0–20)

## 2021-05-07 NOTE — Patient Instructions (Signed)
Your procedure is scheduled on: 05/19/21 Report to [place]. To find out your arrival time please call [phone] between 1PM - 3PM on 05/18/21.  Remember: Instructions that are not followed completely may result in serious medical risk, up to and including death, or upon the discretion of your surgeon and anesthesiologist your surgery may need to be rescheduled.     _X__ 1. Do not eat food after midnight the night before your procedure.                 No gum chewing or hard candies. You may drink clear liquids up to 2 hours                 before you are scheduled to arrive for your surgery- DO not drink clear                 liquids within 2 hours of the start of your surgery.                 Clear Liquids include:  water, apple juice without pulp, clear carbohydrate                 drink such as Clearfast or Gatorade, Black Coffee or Tea (Do not add                 anything to coffee or tea). Diabetics water only  Finish the Ensure "clear" pre surgery drink 2 hours before arrival  __X__2.  On the morning of surgery brush your teeth with toothpaste and water, you                 may rinse your mouth with mouthwash if you wish.  Do not swallow any              toothpaste of mouthwash.     _X__ 3.  No Alcohol for 24 hours before or after surgery.   _X__ 4.  Do Not Smoke or use e-cigarettes For 24 Hours Prior to Your Surgery.                 Do not use any chewable tobacco products for at least 6 hours prior to                 surgery.  ____  5.  Bring all medications with you on the day of surgery if instructed.   __X__  6.  Notify your doctor if there is any change in your medical condition      (cold, fever, infections).     Do not wear jewelry, make-up, hairpins, clips or nail polish. Do not wear lotions, powders, or perfumes.  Do not shave body hair 48 hours prior to surgery. Men may shave face and neck. Do not bring  valuables to the hospital.    [facility] is not responsible for any belongings or valuables.  Contacts, dentures/partials or body piercings may not be worn into surgery. Bring a case for your contacts, glasses or hearing aids, a denture cup will be supplied. Leave your suitcase in the car. After surgery it may be brought to your room. For patients admitted to the hospital, discharge time is determined by your treatment team.   Patients discharged the day of surgery will not be allowed to drive home.   Please read over the following fact sheets that you were given:   MRSA Information, CHG soap,  Incentive Spirometer, Ensure  __X__ Take these medicines the morning of surgery with A SIP OF WATER:    1. none  2.   3.   4.  5.  6.  ____ Fleet Enema (as directed)   __X__ Use CHG Soap/SAGE wipes as directed  ____ Use inhalers on the day of surgery  ____ Stop metformin/Janumet/Farxiga 2 days prior to surgery    ____ Take 1/2 of usual insulin dose the night before surgery. No insulin the morning          of surgery.   ____ Stop Blood Thinners Coumadin/Plavix/Xarelto/Pleta/Pradaxa/Eliquis/Effient/Aspirin  on   Or contact your Surgeon, Cardiologist or Medical Doctor regarding  ability to stop your blood thinners  __X__ Stop Anti-inflammatories 7 days before surgery such as Advil, Ibuprofen, Motrin,  BC or Goodies Powder, Naprosyn, Naproxen, Aleve, Aspirin   OK to take Tylenol or your Tramadol if needed  __X__ Stop all herbals and supplements, fish oil or vitamins  until after surgery.    ____ Bring C-Pap to the hospital.      How to Use an Incentive Spirometer An incentive spirometer is a tool that measures how well you are filling your lungs with each breath. Learning to take long, deep breaths using this tool can help you keep your lungs clear and active. This may help to reverse or lessen your chance of developing breathing (pulmonary) problems, especially infection. You may be  asked to use a spirometer: After a surgery. If you have a lung problem or a history of smoking. After a long period of time when you have been unable to move or be active. If the spirometer includes an indicator to show the highest number that you have reached, your health care provider or respiratory therapist will help you set a goal. Keep a log of your progress as told by your health care provider. What are the risks? Breathing too quickly may cause dizziness or cause you to pass out. Take your time so you do not get dizzy or light-headed. If you are in pain, you may need to take pain medicine before doing incentive spirometry. It is harder to take a deep breath if you are having pain. How to use your incentive spirometer  Sit up on the edge of your bed or on a chair. Hold the incentive spirometer so that it is in an upright position. Before you use the spirometer, breathe out normally. Place the mouthpiece in your mouth. Make sure your lips are closed tightly around it. Breathe in slowly and as deeply as you can through your mouth, causing the piston or the ball to rise toward the top of the chamber. Hold your breath for 3-5 seconds, or for as long as possible. If the spirometer includes a coach indicator, use this to guide you in breathing. Slow down your breathing if the indicator goes above the marked areas. Remove the mouthpiece from your mouth and breathe out normally. The piston or ball will return to the bottom of the chamber. Rest for a few seconds, then repeat the steps 10 or more times. Take your time and take a few normal breaths between deep breaths so that you do not get dizzy or light-headed. Do this every 1-2 hours when you are awake. If the spirometer includes a goal marker to show the highest number you have reached (best effort), use this as a goal to work toward during each repetition. After each set of 10 deep breaths, cough a few  times. This will help to make sure that  your lungs are clear. If you have an incision on your chest or abdomen from surgery, place a pillow or a rolled-up towel firmly against the incision when you cough. This can help to reduce pain while taking deep breaths and coughing. General tips When you are able to get out of bed: Walk around often. Continue to take deep breaths and cough in order to clear your lungs. Keep using the incentive spirometer until your health care provider says it is okay to stop using it. If you have been in the hospital, you may be told to keep using the spirometer at home. Contact a health care provider if: You are having difficulty using the spirometer. You have trouble using the spirometer as often as instructed. Your pain medicine is not giving enough relief for you to use the spirometer as told. You have a fever. Get help right away if: You develop shortness of breath. You develop a cough with bloody mucus from the lungs. You have fluid or blood coming from an incision site after you cough. Summary An incentive spirometer is a tool that can help you learn to take long, deep breaths to keep your lungs clear and active. You may be asked to use a spirometer after a surgery, if you have a lung problem or a history of smoking, or if you have been inactive for a long period of time. Use your incentive spirometer as instructed every 1-2 hours while you are awake. If you have an incision on your chest or abdomen, place a pillow or a rolled-up towel firmly against your incision when you cough. This will help to reduce pain. Get help right away if you have shortness of breath, you cough up bloody mucus, or blood comes from your incision when you cough. This information is not intended to replace advice given to you by your health care provider. Make sure you discuss any questions you have with your health care provider. Document Revised: 06/17/2019 Document Reviewed: 06/17/2019 Elsevier Patient Education  [place].

## 2021-05-16 NOTE — H&P (Signed)
ORTHOPAEDIC HISTORY & PHYSICAL Gwenlyn Fudge, [place] - 05/11/2021 8:15 AM EST Formatting of this note is different from the original. [place] [facility] Chief Complaint:   Chief Complaint  Patient presents with   Knee Pain  H & P RIGHT KNEE   History of Present Illness:   Derek Joseph is a 56 y.o. male that presents to clinic today for his preoperative history and evaluation. Patient presents unaccompanied. The patient is scheduled to undergo a right total knee arthroplasty on 05/19/21 by Dr. Marry Guan. His pain began approximately 1 year ago. The pain is located primarily along the medial aspect of the knee. He describes his pain as worse with weightbearing and going up and down stairs. He reports associated swelling with some giving way of the knee. He denies associated numbness or tingling, denies locking.   The patient's symptoms have progressed to the point that they decrease his quality of life. The patient has previously undergone conservative treatment including NSAIDS and injections to the knee without adequate control of his symptoms.  Denies significant cardiac history, history of DVTs, or lumbar surgery.   Patient lives at home with his wife who is available to assist him post-operatively.   Past Medical, Surgical, Family, Social History, Allergies, Medications:   Past Medical History:  Past Medical History:  Diagnosis Date   Arthritis   Colon polyps 04/26/2019   Diverticulitis 01/2021   Morbid obesity with BMI of 40.0-44.9, adult (CMS-HCC) 10/24/2018   Osteoarthritis   Primary osteoarthritis of right ankle 10/24/2018   Status post total left knee replacement 39/76/7341   Umbilical hernia 93/79/0240   Past Surgical History:  Past Surgical History:  Procedure Laterality Date   COLONOSCOPY W/REMOVAL LESIONS BY SNARE 01/07/2016  Procedure: COLONOSCOPY, FLEXIBLE; WITH REMOVAL OF TUMOR(S), POLYP(S), OR OTHER LESION(S) BY SNARE  TECHNIQUE; Surgeon: Cora Daniels, MD; Location: [facility]; Service: Gastroenterology;;   COLONOSCOPY W/BIOPSY 01/07/2016  Dr. Jerilynn Mages. Feiler @ [facility] Adenomatous Polyps, rpt 3 yrs per provider   Left total knee arthroplasty using computer-assisted navigation 02/20/2019  Dr Marry Guan   COLONOSCOPY 10/21/2019  (4) Tubular adenoma of the colon [facility] 10/2022   APPENDECTOMY   HERNIA REPAIR   TONSILLECTOMY   Current Medications:  Current Outpatient Medications  Medication Sig Dispense Refill   acetaminophen (TYLENOL) 325 MG tablet Take 650 mg by mouth every 4 (four) hours as needed for Pain   celecoxib (CELEBREX) 200 MG capsule Take 1 capsule (200 mg total) by mouth 2 (two) times daily as needed for Pain 60 capsule 1   lovastatin (MEVACOR) 20 MG tablet TAKE 1 TABLET BY MOUTH ONCE DAILY WITH DINNER 90 tablet 0   traMADoL (ULTRAM) 50 mg tablet Take 1 tablet (50 mg total) by mouth every 6 (six) hours as needed for Pain for up to 30 doses 30 tablet 0   No current facility-administered medications for this visit.   Allergies:  Allergies  Allergen Reactions   Codeine Abdominal Pain  Constipation: immediate and severe   Social History:  Social History   Socioeconomic History   Marital status: Married  Spouse name: [name]   Number of children: 1   Years of education: 10  Occupational History   Occupation: Full-time-Inventory Control  Tobacco Use   Smoking status: Former  Packs/day: 1.50  Years: 35.00  Pack years: 52.50  Types: Cigarettes  Quit date: 01/2013  Years since quitting: 8.3   Smokeless tobacco: Never  Vaping  Use   Vaping Use: Never used  Substance and Sexual Activity   Alcohol use: Yes  Comment: occassionally   Drug use: Yes  Types: Marijuana  Comment: every now and then   Sexual activity: Yes  Partners: Female  Social History Narrative  Married, 1 son  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[employer]  Former smoker 1ppd x 24yrs, quit 2014   Rare etoh  Occasional marijuana  Caffeine 3 cups coffee daily  Golf most weeks   Family History:  Family History  Problem Relation Age of Onset   Coronary Artery Disease (Blocked arteries around heart) Mother 43  MI, died   Bipolar disorder Mother   Depression Mother   Skin cancer Mother  melanoma   Asthma Son  as a young child   Colon cancer Maternal Aunt   No Known Problems Father   Colon polyps Sister   No Known Problems Brother   Coronary Artery Disease (Blocked arteries around heart) Paternal Grandmother   Diverticulitis Sister   Review of Systems:   A 10+ ROS was performed, reviewed, and the pertinent orthopaedic findings are documented in the HPI.   Physical Examination:   BP (!) 132/100 (BP Location: Left upper arm, Patient Position: Sitting, BP Cuff Size: Large Adult)   Ht 170.2 cm (5\' 7" )   Wt (!) 124.3 kg (274 lb)   BMI 42.91 kg/m   Patient is a well-developed, well-nourished male in no acute distress. Patient has normal mood and affect. Patient is alert and oriented to person, place, and time.   HEENT: Atraumatic, normocephalic. Pupils equal and reactive to light. Extraocular motion intact. Noninjected sclera.  Cardiovascular: Regular rate and rhythm, with no murmurs, rubs, or gallops. Distal pulses palpable.  Respiratory: Lungs clear to auscultation bilaterally.   Right Knee: Soft tissue swelling: minimal Effusion: none Erythema: none Crepitance: mild Tenderness: medial Alignment: relative varus Mediolateral laxity: medial pseudolaxity Posterior sag: negative Patellar tracking: Good tracking without evidence of subluxation or tilt Atrophy: No significant atrophy.  Quadriceps tone was fair to good. Range of motion: 0/2/118 degrees  Sensation intact over the saphenous, lateral sural cutaneous, superficial fibular, and deep fibular nerve distributions.  Tests Performed/Reviewed:  X-rays  Reviewed x-rays of the right knee were reviewed. Images  reveal moderate to severe loss of medial compartment joint space with osteophyte formation noted. Loss of patellofemoral joint space noted. No fractures or dislocations.  Impression:   ICD-10-CM  1. Primary osteoarthritis of right knee M17.11   Plan:   The patient has end-stage degenerative changes of the right knee. It was explained to the patient that the condition is progressive in nature. Having failed conservative treatment, the patient has elected to proceed with a total joint arthroplasty. The patient will undergo a total joint arthroplasty with Dr. Marry Guan. The risks of surgery, including blood clot and infection, were discussed with the patient. Measures to reduce these risks, including the use of anticoagulation, perioperative antibiotics, and early ambulation were discussed. The importance of postoperative physical therapy was discussed with the patient. The patient elects to proceed with surgery. The patient is instructed to stop all blood thinners prior to surgery. The patient is instructed to call the hospital the day before surgery to learn of the proper arrival time.   Contact our office with any questions or concerns. Follow up as indicated, or sooner should any new problems arise, if conditions worsen, or if they are otherwise concerned.   Gwenlyn Fudge, [facility] [place],  [id number] Phone: [phone]  This note was generated in part with voice recognition software and I apologize for any typographical errors that were not detected and corrected.  Electronically signed by Gwenlyn Fudge, PA at 05/11/2021 6:13 PM EST

## 2021-05-17 ENCOUNTER — Other Ambulatory Visit: Payer: Self-pay

## 2021-05-17 ENCOUNTER — Other Ambulatory Visit
Admission: RE | Admit: 2021-05-17 | Discharge: 2021-05-17 | Disposition: A | Discharge status: Home / Self Care | Payer: BC Managed Care – PPO | Source: Ambulatory Visit | Attending: Orthopedic Surgery | Admitting: Orthopedic Surgery

## 2021-05-17 DIAGNOSIS — Z20822 Contact with and (suspected) exposure to covid-19: Secondary | ICD-10-CM | POA: Diagnosis not present

## 2021-05-17 DIAGNOSIS — Z01812 Encounter for preprocedural laboratory examination: Secondary | ICD-10-CM | POA: Diagnosis present

## 2021-05-18 LAB — SARS CORONAVIRUS 2 (TAT 6-24 HRS): SARS Coronavirus 2: NEGATIVE

## 2021-05-19 DIAGNOSIS — M1711 Unilateral primary osteoarthritis, right knee: Secondary | ICD-10-CM

## 2021-05-27 ENCOUNTER — Inpatient Hospital Stay
Admission: RE | Admit: 2021-05-27 | Discharge: 2021-05-27 | Disposition: A | Discharge status: Home / Self Care | Payer: Managed Care, Other (non HMO) | Source: Ambulatory Visit

## 2021-05-27 NOTE — Pre-Procedure Instructions (Signed)
Spoke briefly to patient, he states he has not started any new medications and still has his instructions, soap and carb drink. Patient encouraged to call our office if he has any new questions or concerns. Covid test and repeat T/S to be done [facility] 2/22.

## 2021-06-02 ENCOUNTER — Encounter: Payer: Self-pay | Admitting: Urgent Care

## 2021-06-02 ENCOUNTER — Other Ambulatory Visit
Admission: RE | Admit: 2021-06-02 | Discharge: 2021-06-02 | Disposition: A | Discharge status: Home / Self Care | Payer: BC Managed Care – PPO | Source: Ambulatory Visit | Attending: Orthopedic Surgery | Admitting: Orthopedic Surgery

## 2021-06-02 ENCOUNTER — Other Ambulatory Visit: Admission: RE | Admit: 2021-06-02 | Payer: BC Managed Care – PPO | Source: Ambulatory Visit

## 2021-06-02 ENCOUNTER — Other Ambulatory Visit: Payer: Self-pay

## 2021-06-02 DIAGNOSIS — Z01812 Encounter for preprocedural laboratory examination: Secondary | ICD-10-CM | POA: Diagnosis present

## 2021-06-02 DIAGNOSIS — Z20822 Contact with and (suspected) exposure to covid-19: Secondary | ICD-10-CM | POA: Insufficient documentation

## 2021-06-02 LAB — SARS CORONAVIRUS 2 (TAT 6-24 HRS): SARS Coronavirus 2: NEGATIVE

## 2021-06-02 LAB — TYPE AND SCREEN
ABO/RH(D): B POS
Antibody Screen: NEGATIVE

## 2021-06-04 ENCOUNTER — Ambulatory Visit: Payer: BC Managed Care – PPO | Admitting: Anesthesiology

## 2021-06-04 ENCOUNTER — Observation Stay: Payer: BC Managed Care – PPO

## 2021-06-04 ENCOUNTER — Observation Stay
Admission: RE | Admit: 2021-06-04 | Discharge: 2021-06-05 | Disposition: A | Discharge status: Home Health | Payer: BC Managed Care – PPO | Attending: Orthopedic Surgery | Admitting: Orthopedic Surgery

## 2021-06-04 ENCOUNTER — Encounter: Payer: Self-pay | Admitting: Orthopedic Surgery

## 2021-06-04 ENCOUNTER — Other Ambulatory Visit: Payer: Self-pay

## 2021-06-04 ENCOUNTER — Encounter
Admission: RE | Disposition: A | Discharge status: Home Health | Payer: Self-pay | Source: Home / Self Care | Attending: Orthopedic Surgery

## 2021-06-04 ENCOUNTER — Ambulatory Visit: Payer: BC Managed Care – PPO | Admitting: Urgent Care

## 2021-06-04 DIAGNOSIS — Z96652 Presence of left artificial knee joint: Secondary | ICD-10-CM | POA: Insufficient documentation

## 2021-06-04 DIAGNOSIS — Z87891 Personal history of nicotine dependence: Secondary | ICD-10-CM | POA: Diagnosis not present

## 2021-06-04 DIAGNOSIS — Z6841 Body Mass Index (BMI) 40.0 and over, adult: Secondary | ICD-10-CM | POA: Diagnosis not present

## 2021-06-04 DIAGNOSIS — Z96659 Presence of unspecified artificial knee joint: Secondary | ICD-10-CM

## 2021-06-04 DIAGNOSIS — M1711 Unilateral primary osteoarthritis, right knee: Principal | ICD-10-CM | POA: Insufficient documentation

## 2021-06-04 HISTORY — PX: KNEE ARTHROPLASTY: SHX992

## 2021-06-04 SURGERY — ARTHROPLASTY, KNEE, TOTAL, USING IMAGELESS COMPUTER-ASSISTED NAVIGATION
Anesthesia: Spinal | Site: Knee | Laterality: Right

## 2021-06-04 MED ORDER — GABAPENTIN 300 MG PO CAPS: 300.0000 mg | ORAL_CAPSULE | Freq: Once | ORAL | Status: AC

## 2021-06-04 MED ORDER — PHENOL 1.4 % MT LIQD
1.0000 | OROMUCOSAL | Status: DC | PRN
  Filled 2021-06-04: qty 177

## 2021-06-04 MED ORDER — ACETAMINOPHEN 10 MG/ML IV SOLN
INTRAVENOUS | Status: DC | PRN
  Administered 2021-06-04: 1000 mg via INTRAVENOUS

## 2021-06-04 MED ORDER — ENOXAPARIN SODIUM 30 MG/0.3ML IJ SOSY
30.0000 mg | PREFILLED_SYRINGE | Freq: Two times a day (BID) | INTRAMUSCULAR | Status: DC
  Administered 2021-06-05: 30 mg via SUBCUTANEOUS
  Filled 2021-06-04: qty 0.3

## 2021-06-04 MED ORDER — CEFAZOLIN SODIUM-DEXTROSE 2-4 GM/100ML-% IV SOLN
INTRAVENOUS | Status: AC
  Filled 2021-06-04: qty 100

## 2021-06-04 MED ORDER — HYDROMORPHONE HCL 1 MG/ML IJ SOLN: 0.5000 mg | INTRAMUSCULAR | Status: DC | PRN

## 2021-06-04 MED ORDER — DEXAMETHASONE SODIUM PHOSPHATE 10 MG/ML IJ SOLN: 8.0000 mg | Freq: Once | INTRAMUSCULAR | Status: AC

## 2021-06-04 MED ORDER — TRANEXAMIC ACID-NACL 1000-0.7 MG/100ML-% IV SOLN
1000.0000 mg | Freq: Once | INTRAVENOUS | Status: AC
  Administered 2021-06-04: 1000 mg via INTRAVENOUS

## 2021-06-04 MED ORDER — OXYCODONE HCL 5 MG PO TABS
5.0000 mg | ORAL_TABLET | ORAL | Status: DC | PRN
  Administered 2021-06-05: 5 mg via ORAL
  Filled 2021-06-04: qty 1

## 2021-06-04 MED ORDER — ONDANSETRON HCL 4 MG/2ML IJ SOLN: 4.0000 mg | Freq: Four times a day (QID) | INTRAMUSCULAR | Status: DC | PRN

## 2021-06-04 MED ORDER — MIDAZOLAM HCL 5 MG/5ML IJ SOLN
INTRAMUSCULAR | Status: DC | PRN
  Administered 2021-06-04: 2 mg via INTRAVENOUS

## 2021-06-04 MED ORDER — PRONTOSAN WOUND IRRIGATION OPTIME
TOPICAL | Status: DC | PRN
  Administered 2021-06-04: 1

## 2021-06-04 MED ORDER — MAGNESIUM HYDROXIDE 400 MG/5ML PO SUSP
30.0000 mL | Freq: Every day | ORAL | Status: DC
  Administered 2021-06-04 – 2021-06-05 (×2): 30 mL via ORAL
  Filled 2021-06-04 (×3): qty 30

## 2021-06-04 MED ORDER — TRAMADOL HCL 50 MG PO TABS: 50.0000 mg | ORAL_TABLET | ORAL | Status: DC | PRN

## 2021-06-04 MED ORDER — DIPHENHYDRAMINE HCL 12.5 MG/5ML PO ELIX: 12.5000 mg | ORAL_SOLUTION | ORAL | Status: DC | PRN

## 2021-06-04 MED ORDER — OXYCODONE HCL 5 MG/5ML PO SOLN: 5.0000 mg | Freq: Once | ORAL | Status: DC | PRN

## 2021-06-04 MED ORDER — CELECOXIB 200 MG PO CAPS
400.0000 mg | ORAL_CAPSULE | Freq: Once | ORAL | Status: AC
Start: 2021-06-04 — End: 2021-06-04

## 2021-06-04 MED ORDER — PANTOPRAZOLE SODIUM 40 MG PO TBEC
40.0000 mg | DELAYED_RELEASE_TABLET | Freq: Two times a day (BID) | ORAL | Status: DC
  Administered 2021-06-04 – 2021-06-05 (×2): 40 mg via ORAL
  Filled 2021-06-04 (×2): qty 1

## 2021-06-04 MED ORDER — SODIUM CHLORIDE (PF) 0.9 % IJ SOLN
INTRAMUSCULAR | Status: DC | PRN
  Administered 2021-06-04: 120 mL via INTRAMUSCULAR

## 2021-06-04 MED ORDER — OXYCODONE HCL 5 MG PO TABS
10.0000 mg | ORAL_TABLET | ORAL | Status: DC | PRN
  Administered 2021-06-04 – 2021-06-05 (×2): 10 mg via ORAL
  Filled 2021-06-04 (×2): qty 2

## 2021-06-04 MED ORDER — FAMOTIDINE 20 MG PO TABS: 20.0000 mg | ORAL_TABLET | Freq: Once | ORAL | Status: AC

## 2021-06-04 MED ORDER — ORAL CARE MOUTH RINSE: 15.0000 mL | Freq: Once | OROMUCOSAL | Status: AC

## 2021-06-04 MED ORDER — BISACODYL 10 MG RE SUPP
10.0000 mg | Freq: Every day | RECTAL | Status: DC | PRN
Start: 2021-06-04 — End: 2021-06-05

## 2021-06-04 MED ORDER — CELECOXIB 200 MG PO CAPS
200.0000 mg | ORAL_CAPSULE | Freq: Two times a day (BID) | ORAL | Status: DC
  Administered 2021-06-04 – 2021-06-05 (×2): 200 mg via ORAL
  Filled 2021-06-04 (×2): qty 1

## 2021-06-04 MED ORDER — CELECOXIB 200 MG PO CAPS
ORAL_CAPSULE | ORAL | Status: AC
  Filled 2021-06-04: qty 1

## 2021-06-04 MED ORDER — ACETAMINOPHEN 325 MG PO TABS: 325.0000 mg | ORAL_TABLET | Freq: Four times a day (QID) | ORAL | Status: DC | PRN

## 2021-06-04 MED ORDER — 0.9 % SODIUM CHLORIDE (POUR BTL) OPTIME
TOPICAL | Status: DC | PRN
  Administered 2021-06-04: 500 mL

## 2021-06-04 MED ORDER — ACETAMINOPHEN 10 MG/ML IV SOLN: 1000.0000 mg | Freq: Once | INTRAVENOUS | Status: DC | PRN

## 2021-06-04 MED ORDER — PROPOFOL 500 MG/50ML IV EMUL
INTRAVENOUS | Status: DC | PRN
Start: 2021-06-04 — End: 2021-06-04
  Administered 2021-06-04: 110 ug/kg/min via INTRAVENOUS

## 2021-06-04 MED ORDER — METOCLOPRAMIDE HCL 10 MG PO TABS
10.0000 mg | ORAL_TABLET | Freq: Three times a day (TID) | ORAL | Status: DC
  Administered 2021-06-04 – 2021-06-05 (×3): 10 mg via ORAL
  Filled 2021-06-04 (×3): qty 1

## 2021-06-04 MED ORDER — TRANEXAMIC ACID-NACL 1000-0.7 MG/100ML-% IV SOLN
INTRAVENOUS | Status: AC
  Filled 2021-06-04: qty 100

## 2021-06-04 MED ORDER — SODIUM CHLORIDE 0.9 % IR SOLN
Status: DC | PRN
  Administered 2021-06-04: 3000 mL

## 2021-06-04 MED ORDER — SENNOSIDES-DOCUSATE SODIUM 8.6-50 MG PO TABS
1.0000 | ORAL_TABLET | Freq: Two times a day (BID) | ORAL | Status: DC
  Administered 2021-06-04 – 2021-06-05 (×2): 1 via ORAL
  Filled 2021-06-04 (×2): qty 1

## 2021-06-04 MED ORDER — PRAVASTATIN SODIUM 20 MG PO TABS: 20.0000 mg | ORAL_TABLET | Freq: Every day | ORAL | Status: DC

## 2021-06-04 MED ORDER — PHENYLEPHRINE HCL (PRESSORS) 10 MG/ML IV SOLN
INTRAVENOUS | Status: DC | PRN
  Administered 2021-06-04 (×3): 80 ug via INTRAVENOUS

## 2021-06-04 MED ORDER — PROPOFOL 10 MG/ML IV BOLUS
INTRAVENOUS | Status: AC
  Filled 2021-06-04: qty 20

## 2021-06-04 MED ORDER — MENTHOL 3 MG MT LOZG
1.0000 | LOZENGE | OROMUCOSAL | Status: DC | PRN
  Filled 2021-06-04: qty 9

## 2021-06-04 MED ORDER — FERROUS SULFATE 325 (65 FE) MG PO TABS
325.0000 mg | ORAL_TABLET | Freq: Two times a day (BID) | ORAL | Status: DC
  Administered 2021-06-05: 325 mg via ORAL
  Filled 2021-06-04: qty 1

## 2021-06-04 MED ORDER — CHLORHEXIDINE GLUCONATE 4 % EX LIQD: 60.0000 mL | Freq: Once | CUTANEOUS | Status: DC

## 2021-06-04 MED ORDER — CHLORHEXIDINE GLUCONATE 0.12 % MT SOLN
OROMUCOSAL | Status: AC
  Administered 2021-06-04: 15 mL via OROMUCOSAL
  Filled 2021-06-04: qty 15

## 2021-06-04 MED ORDER — PROPOFOL 1000 MG/100ML IV EMUL
INTRAVENOUS | Status: AC
  Filled 2021-06-04: qty 100

## 2021-06-04 MED ORDER — GABAPENTIN 300 MG PO CAPS
ORAL_CAPSULE | ORAL | Status: AC
  Administered 2021-06-04: 300 mg via ORAL
  Filled 2021-06-04: qty 1

## 2021-06-04 MED ORDER — CEFAZOLIN SODIUM-DEXTROSE 2-4 GM/100ML-% IV SOLN
2.0000 g | Freq: Four times a day (QID) | INTRAVENOUS | Status: AC
  Administered 2021-06-04 – 2021-06-05 (×2): 2 g via INTRAVENOUS
  Filled 2021-06-04 (×2): qty 100

## 2021-06-04 MED ORDER — DROPERIDOL 2.5 MG/ML IJ SOLN
0.6250 mg | Freq: Once | INTRAMUSCULAR | Status: DC | PRN
  Filled 2021-06-04: qty 2

## 2021-06-04 MED ORDER — ALUM & MAG HYDROXIDE-SIMETH 200-200-20 MG/5ML PO SUSP: 30.0000 mL | ORAL | Status: DC | PRN

## 2021-06-04 MED ORDER — LACTATED RINGERS IV SOLN: INTRAVENOUS | Status: DC

## 2021-06-04 MED ORDER — OXYCODONE HCL 5 MG PO TABS: 5.0000 mg | ORAL_TABLET | Freq: Once | ORAL | Status: DC | PRN

## 2021-06-04 MED ORDER — ACETAMINOPHEN 10 MG/ML IV SOLN
INTRAVENOUS | Status: AC
  Filled 2021-06-04: qty 100

## 2021-06-04 MED ORDER — PROPOFOL 10 MG/ML IV BOLUS
INTRAVENOUS | Status: AC
  Filled 2021-06-04: qty 40

## 2021-06-04 MED ORDER — CEFAZOLIN IN SODIUM CHLORIDE 3-0.9 GM/100ML-% IV SOLN
3.0000 g | INTRAVENOUS | Status: AC
  Administered 2021-06-04: 3 g via INTRAVENOUS
  Filled 2021-06-04: qty 100

## 2021-06-04 MED ORDER — FENTANYL CITRATE (PF) 100 MCG/2ML IJ SOLN: 25.0000 ug | INTRAMUSCULAR | Status: DC | PRN

## 2021-06-04 MED ORDER — FLEET ENEMA 7-19 GM/118ML RE ENEM: 1.0000 | ENEMA | Freq: Once | RECTAL | Status: DC | PRN

## 2021-06-04 MED ORDER — ACETAMINOPHEN 10 MG/ML IV SOLN
1000.0000 mg | Freq: Four times a day (QID) | INTRAVENOUS | Status: DC
  Administered 2021-06-04 – 2021-06-05 (×3): 1000 mg via INTRAVENOUS
  Filled 2021-06-04 (×3): qty 100

## 2021-06-04 MED ORDER — EPHEDRINE SULFATE (PRESSORS) 50 MG/ML IJ SOLN
INTRAMUSCULAR | Status: DC | PRN
  Administered 2021-06-04: 10 mg via INTRAVENOUS

## 2021-06-04 MED ORDER — ONDANSETRON HCL 4 MG PO TABS: 4.0000 mg | ORAL_TABLET | Freq: Four times a day (QID) | ORAL | Status: DC | PRN

## 2021-06-04 MED ORDER — CHLORHEXIDINE GLUCONATE 0.12 % MT SOLN: 15.0000 mL | Freq: Once | OROMUCOSAL | Status: AC

## 2021-06-04 MED ORDER — TRANEXAMIC ACID-NACL 1000-0.7 MG/100ML-% IV SOLN
1000.0000 mg | INTRAVENOUS | Status: AC
  Administered 2021-06-04: 1000 mg via INTRAVENOUS

## 2021-06-04 MED ORDER — ENSURE PRE-SURGERY PO LIQD
296.0000 mL | Freq: Once | ORAL | Status: AC
  Administered 2021-06-04: 296 mL via ORAL
  Filled 2021-06-04: qty 296

## 2021-06-04 MED ORDER — SODIUM CHLORIDE 0.9 % IV SOLN: INTRAVENOUS | Status: DC

## 2021-06-04 MED ORDER — MIDAZOLAM HCL 2 MG/2ML IJ SOLN
INTRAMUSCULAR | Status: AC
  Filled 2021-06-04: qty 2

## 2021-06-04 MED ORDER — FAMOTIDINE 20 MG PO TABS
ORAL_TABLET | ORAL | Status: AC
  Administered 2021-06-04: 20 mg via ORAL
  Filled 2021-06-04: qty 1

## 2021-06-04 MED ORDER — DEXAMETHASONE SODIUM PHOSPHATE 10 MG/ML IJ SOLN
INTRAMUSCULAR | Status: AC
  Administered 2021-06-04: 8 mg via INTRAVENOUS
  Filled 2021-06-04: qty 1

## 2021-06-04 SURGICAL SUPPLY — 76 items
ATTUNE MED DOME PAT 41 KNEE (Knees) ×1 IMPLANT
ATTUNE PS FEM RT SZ 7 CEM KNEE (Femur) ×1 IMPLANT
ATTUNE PSRP INSR SZ7 5 KNEE (Insert) ×1 IMPLANT
BASE TIBIAL ROT PLAT SZ 7 KNEE (Knees) IMPLANT
BATTERY INSTRU NAVIGATION (MISCELLANEOUS) ×8 IMPLANT
BLADE SAW 70X12.5 (BLADE) ×2 IMPLANT
BLADE SAW 90X13X1.19 OSCILLAT (BLADE) ×2 IMPLANT
BLADE SAW 90X25X1.19 OSCILLAT (BLADE) ×2 IMPLANT
BONE CEMENT GENTAMICIN (Cement) ×6 IMPLANT
BOWL CEMENT MIXING ADV NOZZLE (MISCELLANEOUS) ×2 IMPLANT
CEMENT BONE GENTAMICIN 40 (Cement) IMPLANT
COOLER POLAR GLACIER W/PUMP (MISCELLANEOUS) ×2 IMPLANT
CUFF TOURN SGL QUICK 24 (TOURNIQUET CUFF)
CUFF TOURN SGL QUICK 34 (TOURNIQUET CUFF)
CUFF TRNQT CYL 24X4X16.5-23 (TOURNIQUET CUFF) IMPLANT
CUFF TRNQT CYL 34X4.125X (TOURNIQUET CUFF) IMPLANT
DRAPE 3/4 80X56 (DRAPES) ×2 IMPLANT
DRAPE INCISE IOBAN 66X45 STRL (DRAPES) ×2 IMPLANT
DRSG DERMACEA 8X12 NADH (GAUZE/BANDAGES/DRESSINGS) ×2 IMPLANT
DRSG MEPILEX SACRM 8.7X9.8 (GAUZE/BANDAGES/DRESSINGS) ×2 IMPLANT
DRSG OPSITE POSTOP 4X14 (GAUZE/BANDAGES/DRESSINGS) ×2 IMPLANT
DRSG TEGADERM 4X4.75 (GAUZE/BANDAGES/DRESSINGS) ×2 IMPLANT
DURAPREP 26ML APPLICATOR (WOUND CARE) ×4 IMPLANT
ELECT CAUTERY BLADE 6.4 (BLADE) ×2 IMPLANT
ELECT REM PT RETURN 9FT ADLT (ELECTROSURGICAL) ×2
ELECTRODE REM PT RTRN 9FT ADLT (ELECTROSURGICAL) ×1 IMPLANT
EX-PIN ORTHOLOCK NAV 4X150 (PIN) ×4 IMPLANT
GLOVE SRG 8 PF TXTR STRL LF DI (GLOVE) ×1 IMPLANT
GLOVE SURG ENC TEXT LTX SZ7.5 (GLOVE) ×8 IMPLANT
GLOVE SURG UNDER POLY LF SZ7.5 (GLOVE) ×2 IMPLANT
GLOVE SURG UNDER POLY LF SZ8 (GLOVE) ×1
GOWN STRL REUS W/ TWL LRG LVL3 (GOWN DISPOSABLE) ×2 IMPLANT
GOWN STRL REUS W/ TWL XL LVL3 (GOWN DISPOSABLE) ×1 IMPLANT
GOWN STRL REUS W/TWL LRG LVL3 (GOWN DISPOSABLE) ×2
GOWN STRL REUS W/TWL XL LVL3 (GOWN DISPOSABLE) ×1
HEMOVAC 400CC 10FR (MISCELLANEOUS) ×2 IMPLANT
HOLDER FOLEY CATH W/STRAP (MISCELLANEOUS) ×2 IMPLANT
HOLSTER ELECTROSUGICAL PENCIL (MISCELLANEOUS) ×2 IMPLANT
IV NS IRRIG 3000ML ARTHROMATIC (IV SOLUTION) ×2 IMPLANT
KIT TURNOVER KIT A (KITS) ×2 IMPLANT
KNIFE SCULPS 14X20 (INSTRUMENTS) ×2 IMPLANT
LABEL OR SOLS (LABEL) ×2 IMPLANT
MANIFOLD NEPTUNE II (INSTRUMENTS) ×4 IMPLANT
NDL SAFETY ECLIPSE 18X1.5 (NEEDLE) ×1 IMPLANT
NDL SPNL 20GX3.5 QUINCKE YW (NEEDLE) ×2 IMPLANT
NEEDLE HYPO 18GX1.5 SHARP (NEEDLE)
NEEDLE SPNL 20GX3.5 QUINCKE YW (NEEDLE) ×4 IMPLANT
NS IRRIG 500ML POUR BTL (IV SOLUTION) ×2 IMPLANT
PACK TOTAL KNEE (MISCELLANEOUS) ×2 IMPLANT
PAD ABD DERMACEA PRESS 5X9 (GAUZE/BANDAGES/DRESSINGS) ×4 IMPLANT
PAD WRAPON POLAR KNEE (MISCELLANEOUS) ×1 IMPLANT
PENCIL SMOKE EVACUATOR COATED (MISCELLANEOUS) ×2 IMPLANT
PIN DRILL FIX HALF THREAD (BIT) ×4 IMPLANT
PIN FIXATION 1/8DIA X 3INL (PIN) ×2 IMPLANT
PULSAVAC PLUS IRRIG FAN TIP (DISPOSABLE) ×2
SOL PREP PVP 2OZ (MISCELLANEOUS) ×2
SOLUTION PREP PVP 2OZ (MISCELLANEOUS) ×1 IMPLANT
SOLUTION PRONTOSAN WOUND 350ML (IRRIGATION / IRRIGATOR) ×2 IMPLANT
SPONGE DRAIN TRACH 4X4 STRL 2S (GAUZE/BANDAGES/DRESSINGS) ×2 IMPLANT
STAPLER SKIN PROX 35W (STAPLE) ×2 IMPLANT
STOCKINETTE IMPERV 14X48 (MISCELLANEOUS) IMPLANT
STRAP TIBIA SHORT (MISCELLANEOUS) ×2 IMPLANT
SUCTION FRAZIER HANDLE 10FR (MISCELLANEOUS) ×1
SUCTION TUBE FRAZIER 10FR DISP (MISCELLANEOUS) ×1 IMPLANT
SUT VIC AB 0 CT1 36 (SUTURE) ×4 IMPLANT
SUT VIC AB 1 CT1 36 (SUTURE) ×4 IMPLANT
SUT VIC AB 2-0 CT2 27 (SUTURE) ×2 IMPLANT
SYR 20ML LL LF (SYRINGE) ×2 IMPLANT
SYR 30ML LL (SYRINGE) ×4 IMPLANT
TIBIAL BASE ROT PLAT SZ 7 KNEE (Knees) ×2 IMPLANT
TIP FAN IRRIG PULSAVAC PLUS (DISPOSABLE) ×1 IMPLANT
TOWEL OR 17X26 4PK STRL BLUE (TOWEL DISPOSABLE) ×1 IMPLANT
TRAY FOLEY MTR SLVR 16FR STAT (SET/KITS/TRAYS/PACK) ×2 IMPLANT
WATER STERILE IRR 1000ML POUR (IV SOLUTION) ×1 IMPLANT
WATER STERILE IRR 500ML POUR (IV SOLUTION) ×1 IMPLANT
WRAPON POLAR PAD KNEE (MISCELLANEOUS) ×2

## 2021-06-04 NOTE — Anesthesia Procedure Notes (Signed)
Spinal  Patient location during procedure: OR Start time: 06/04/2021 12:57 PM End time: 06/04/2021 1:04 PM Reason for block: surgical anesthesia Staffing Performed: resident/CRNA  Anesthesiologist: Iran Ouch, MD Resident/CRNA: Fredderick Phenix, CRNA Preanesthetic Checklist Completed: patient identified, IV checked, site marked, risks and benefits discussed, surgical consent, monitors and equipment checked, pre-op evaluation and timeout performed Spinal Block Patient position: sitting Prep: DuraPrep Patient monitoring: heart rate, cardiac monitor, continuous pulse ox and blood pressure Approach: midline Location: L3-4 Injection technique: single-shot Needle Needle type: Sprotte  Needle gauge: 24 G Needle length: 9 cm Assessment Sensory level: T4 Events: CSF return

## 2021-06-04 NOTE — Anesthesia Preprocedure Evaluation (Addendum)
Anesthesia Evaluation  Patient identified by MRN, date of birth, ID band Patient awake    Reviewed: Allergy & Precautions, NPO status , Patient's Chart, lab work & pertinent test results  Airway Mallampati: III  TM Distance: >3 FB Neck ROM: full    Dental no notable dental hx.    Pulmonary neg pulmonary ROS, former smoker,    Pulmonary exam normal        Cardiovascular negative cardio ROS Normal cardiovascular exam     Neuro/Psych negative neurological ROS  negative psych ROS   GI/Hepatic negative GI ROS, (+)     substance abuse  marijuana use,   Endo/Other  Morbid obesity  Renal/GU      Musculoskeletal  (+) Arthritis ,   Abdominal (+) + obese,   Peds  Hematology negative hematology ROS (+)   Anesthesia Other Findings Past Medical History: No date: Arthritis 08/06/2019: Lab test positive for detection of COVID-19 virus No date: Morbid obesity ([place]) No date: Osteoarthritis  Past Surgical History: 2000: APPENDECTOMY 10/21/2019: COLONOSCOPY WITH PROPOFOL; N/A     Comment:  Procedure: COLONOSCOPY WITH PROPOFOL;  Surgeon: [place],               Benay Pike, MD;  Location: [facility] ENDOSCOPY;  Service:               Gastroenterology;  Laterality: N/A;  Patient tested               positive for COVID-19 on 08-06-19 (copy of results on               chart and is in EPIC) 02/20/2019: KNEE ARTHROPLASTY; Left     Comment:  Procedure: COMPUTER ASSISTED TOTAL KNEE ARTHROPLASTY;                Surgeon: Dereck Leep, MD;  Location: [facility] ORS;                Service: Orthopedics;  Laterality: Left; No date: TONSILLECTOMY     Comment:  age 21  and adnoids     Reproductive/Obstetrics negative OB ROS                            Anesthesia Physical Anesthesia Plan  ASA: 3  Anesthesia Plan: Spinal   Post-op Pain Management:    Induction: Intravenous  PONV Risk Score and Plan: Propofol  infusion  Airway Management Planned: Simple Face Mask and Natural Airway  Additional Equipment:   Intra-op Plan:   Post-operative Plan:   Informed Consent: I have reviewed the patients History and Physical, chart, labs and discussed the procedure including the risks, benefits and alternatives for the proposed anesthesia with the patient or authorized representative who has indicated his/her understanding and acceptance.     Dental advisory given  Plan Discussed with: Anesthesiologist, CRNA and Surgeon  Anesthesia Plan Comments:       Anesthesia Quick Evaluation

## 2021-06-04 NOTE — Op Note (Signed)
OPERATIVE NOTE  DATE OF SURGERY:  06/04/2021  PATIENT NAME:  Derek Joseph   DOB: 1965-05-31  MRN: [id number]  PRE-OPERATIVE DIAGNOSIS: Degenerative arthrosis of the right knee, primary  POST-OPERATIVE DIAGNOSIS:  Same  PROCEDURE:  Right total knee arthroplasty using computer-assisted navigation  SURGEON:  Marciano Sequin. M.D.  ANESTHESIA: spinal  ESTIMATED BLOOD LOSS: 50 mL  FLUIDS REPLACED: 1200 mL of crystalloid  TOURNIQUET TIME: 101 minutes  DRAINS: 2 medium Hemovac drains  SOFT TISSUE RELEASES: Anterior cruciate ligament, posterior cruciate ligament, deep medial collateral ligament, patellofemoral ligament  IMPLANTS UTILIZED: DePuy Attune size 7 posterior stabilized femoral component (cemented), size 7 rotating platform tibial component (cemented), 41 mm medialized dome patella (cemented), and a 5 mm stabilized rotating platform polyethylene insert.  INDICATIONS FOR SURGERY: Derek Joseph is a 56 y.o. year old male with a long history of progressive knee pain. X-rays demonstrated severe degenerative changes in tricompartmental fashion. The patient had not seen any significant improvement despite conservative nonsurgical intervention. After discussion of the risks and benefits of surgical intervention, the patient expressed understanding of the risks benefits and agree with plans for total knee arthroplasty.   The risks, benefits, and alternatives were discussed at length including but not limited to the risks of infection, bleeding, nerve injury, stiffness, blood clots, the need for revision surgery, cardiopulmonary complications, among others, and they were willing to proceed.  PROCEDURE IN DETAIL: The patient was brought into the operating room and, after adequate spinal anesthesia was achieved, a tourniquet was placed on the patient's upper thigh. The patient's knee and leg were cleaned and prepped with alcohol and DuraPrep and draped in the usual sterile fashion. A  "timeout" was performed as per usual protocol. The lower extremity was exsanguinated using an Esmarch, and the tourniquet was inflated to 300 mmHg. An anterior longitudinal incision was made followed by a standard mid vastus approach. The deep fibers of the medial collateral ligament were elevated in a subperiosteal fashion off of the medial flare of the tibia so as to maintain a continuous soft tissue sleeve. The patella was subluxed laterally and the patellofemoral ligament was incised. Inspection of the knee demonstrated severe degenerative changes with full-thickness loss of articular cartilage. Osteophytes were debrided using a rongeur. Anterior and posterior cruciate ligaments were excised. Two 4.0 mm Schanz pins were inserted in the femur and into the tibia for attachment of the array of trackers used for computer-assisted navigation. Hip center was identified using a circumduction technique. Distal landmarks were mapped using the computer. The distal femur and proximal tibia were mapped using the computer. The distal femoral cutting guide was positioned using computer-assisted navigation so as to achieve a 5 distal valgus cut. The femur was sized and it was felt that a size 7 femoral component was appropriate. A size 7 femoral cutting guide was positioned and the anterior cut was performed and verified using the computer. This was followed by completion of the posterior and chamfer cuts. Femoral cutting guide for the central box was then positioned in the center box cut was performed.  Attention was then directed to the proximal tibia. Medial and lateral menisci were excised. The extramedullary tibial cutting guide was positioned using computer-assisted navigation so as to achieve a 0 varus-valgus alignment and 3 posterior slope. The cut was performed and verified using the computer. The proximal tibia was sized and it was felt that a size 7 tibial tray was appropriate. Tibial and femoral trials were  inserted followed  by insertion of a 5 mm polyethylene insert. This allowed for excellent mediolateral soft tissue balancing both in flexion and in full extension. Finally, the patella was cut and prepared so as to accommodate a 41 mm medialized dome patella. A patella trial was placed and the knee was placed through a range of motion with excellent patellar tracking appreciated. The femoral trial was removed after debridement of posterior osteophytes. The central post-hole for the tibial component was reamed followed by insertion of a keel punch. Tibial trials were then removed. Cut surfaces of bone were irrigated with copious amounts of normal saline using pulsatile lavage and then suctioned dry. Polymethylmethacrylate cement with gentamicin was prepared in the usual fashion using a vacuum mixer. Cement was applied to the cut surface of the proximal tibia as well as along the undersurface of a size 7 rotating platform tibial component. Tibial component was positioned and impacted into place. Excess cement was removed using [employer] fast streamer. Cement was then applied to the cut surfaces of the femur as well as along the posterior flanges of the size 7 femoral component. The femoral component was positioned and impacted into place. Excess cement was removed using [employer] fast streamer. A 5 mm polyethylene trial was inserted and the knee was brought into full extension with steady axial compression applied. Finally, cement was applied to the backside of a 41 mm medialized dome patella and the patellar component was positioned and patellar clamp applied. Excess cement was removed using [employer] fast streamer. After adequate curing of the cement, the tourniquet was deflated after a total tourniquet time of 101 minutes. Hemostasis was achieved using electrocautery. The knee was irrigated with copious amounts of normal saline using pulsatile lavage followed by 350 ml of Prontosan and then suctioned dry. 20 mL of 1.3% Exparel and 60 mL of  0.25% Marcaine in 40 mL of normal saline was injected along the posterior capsule, medial and lateral gutters, and along the arthrotomy site. A 5 mm stabilized rotating platform polyethylene insert was inserted and the knee was placed through a range of motion with excellent mediolateral soft tissue balancing appreciated and excellent patellar tracking noted. 2 medium drains were placed in the wound bed and brought out through separate stab incisions. The medial parapatellar portion of the incision was reapproximated using interrupted sutures of #1 Vicryl. Subcutaneous tissue was approximated in layers using first #0 Vicryl followed #2-0 Vicryl. The skin was approximated with skin staples. A sterile dressing was applied.  The patient tolerated the procedure well and was transported to the recovery room in stable condition.    Jylan Loeza P. Holley Bouche., M.D.

## 2021-06-04 NOTE — Transfer of Care (Signed)
Immediate Anesthesia Transfer of Care Note  Patient: Derek Joseph  Procedure(s) Performed: COMPUTER ASSISTED TOTAL KNEE ARTHROPLASTY (Right: Knee)  Patient Location: PACU  Anesthesia Type:Spinal  Level of Consciousness: awake and alert   Airway & Oxygen Therapy: Patient Spontanous Breathing and Patient connected to face mask oxygen  Post-op Assessment: Report given to RN and Post -op Vital signs reviewed and stable  Post vital signs: Reviewed and stable  Last Vitals:  Vitals Value Taken Time  BP 121/80 06/04/21 1633  Temp    Pulse 78 06/04/21 1635  Resp 14 06/04/21 1635  SpO2 99 % 06/04/21 1635  Vitals shown include unvalidated device data.  Last Pain:  Vitals:   06/04/21 1024  TempSrc: Tympanic  PainSc: 4          Complications: No notable events documented.

## 2021-06-04 NOTE — H&P (Signed)
The patient has been re-examined, and the chart reviewed, and there have been no interval changes to the documented history and physical.    The risks, benefits, and alternatives have been discussed at length. The patient expressed understanding of the risks benefits and agreed with plans for surgical intervention.  Jen Benedict P. Tania Steinhauser, Jr. M.D.    

## 2021-06-04 NOTE — Plan of Care (Signed)

## 2021-06-04 NOTE — Progress Notes (Signed)
Notified patient wife that he is going to assigned room

## 2021-06-05 DIAGNOSIS — M1711 Unilateral primary osteoarthritis, right knee: Secondary | ICD-10-CM | POA: Diagnosis not present

## 2021-06-05 MED ORDER — ENOXAPARIN SODIUM 40 MG/0.4ML IJ SOSY: 40.0000 mg | PREFILLED_SYRINGE | INTRAMUSCULAR | 0 refills | Status: DC

## 2021-06-05 MED ORDER — ONDANSETRON HCL 4 MG PO TABS
4.0000 mg | ORAL_TABLET | Freq: Four times a day (QID) | ORAL | 0 refills | Status: DC | PRN
Start: 2021-06-05 — End: 2021-12-31

## 2021-06-05 MED ORDER — CELECOXIB 200 MG PO CAPS: 200.0000 mg | ORAL_CAPSULE | Freq: Two times a day (BID) | ORAL | 0 refills | Status: AC

## 2021-06-05 MED ORDER — TRAMADOL HCL 50 MG PO TABS: 50.0000 mg | ORAL_TABLET | ORAL | 0 refills | Status: DC | PRN

## 2021-06-05 MED ORDER — OXYCODONE HCL 5 MG PO TABS: 5.0000 mg | ORAL_TABLET | ORAL | 0 refills | Status: DC | PRN

## 2021-06-05 NOTE — Evaluation (Signed)
Physical Therapy Evaluation Patient Details Name: Derek Joseph MRN: [id number] DOB: [birth year] Today's Date: 06/05/2021  History of Present Illness  Pt admitted to [facility] on 06/04/21 under observation for elective R TKA. Significant PMH includes: OA and morbid obesity.   Clinical Impression  Pt is a 56 year old M admitted to hospital on 2/24 for elective R TKA. At baseline, pt was Ind with all ADL's, IADL's, limited community ambulation without AD, driving, working FT, and medication management. Pt presents with RLE weakness/limited AROM, decreased standing balance, decreased activity tolerance, and increased pain levels, resulting in impaired functional mobility from baseline. Due to deficits, pt required mod I for bed mobility, CGA-supervision for transfers, and CGA-supervision for gait with RW. Deficits limit the pt's ability to safely and independently perform ADL's, transfer, and ambulate. Pt will benefit from acute skilled PT services to address deficits for return to baseline function. At this time, PT recommends HHPT at DC to address deficits and improve overall safety with functional mobility. Will also recommend 3in1 for safety with self care ADL's.        Recommendations for follow up therapy are one component of a multi-disciplinary discharge planning process, led by the attending physician.  Recommendations may be updated based on patient status, additional functional criteria and insurance authorization.  Follow Up Recommendations Home health PT    Assistance Recommended at Discharge PRN     Equipment Recommendations BSC/3in1     Functional Status Assessment Patient has had a recent decline in their functional status and demonstrates the ability to make significant improvements in function in a reasonable and predictable amount of time.     Precautions / Restrictions Precautions Precautions: Fall Precaution Comments: mod fall risk Restrictions Weight Bearing Restrictions:  Yes RLE Weight Bearing: Weight bearing as tolerated      Mobility  Bed Mobility Overal bed mobility: Modified Independent             General bed mobility comments: HOB elevated, increased time/effort for RLE facilitation    Transfers Overall transfer level: Needs assistance Equipment used: Rolling walker (2 wheels) Transfers: Sit to/from Stand Sit to Stand: Min guard, Supervision           General transfer comment: Initially CGA progressing to supervision for safety with STS tranfers in RW; verbal cues for safety, sequencing, and hand placement.    Ambulation/Gait Ambulation/Gait assistance: Min guard, Supervision Gait Distance (Feet): 80 Feet Assistive device: Rolling walker (2 wheels)         General Gait Details: Initially CGA progressing to supervision for safety. Demonstrates decreased R step length/foot clearance, slowed cadence, and step to gait pattern leading with RLE. Cues for pacing.     Balance Overall balance assessment: Needs assistance Sitting-balance support: Bilateral upper extremity supported, No upper extremity supported, Feet supported Sitting balance-Leahy Scale: Normal     Standing balance support: During functional activity, Bilateral upper extremity supported Standing balance-Leahy Scale: Fair Standing balance comment: in RW                             Pertinent Vitals/Pain Pain Assessment Pain Score: 2  Pain Location: R knee Pain Descriptors / Indicators: Aching Pain Intervention(s): Limited activity within patient's tolerance, Monitored during session, Premedicated before session, Repositioned    Home Living Family/patient expects to be discharged to:: Private residence Living Arrangements: Spouse/significant other Available Help at Discharge: Family Type of Home: House Home Access: Stairs to enter Entrance  Stairs-Rails: Right;Left Entrance Stairs-Number of Steps: 5-6   Home Layout: One level Home Equipment:  Conservation officer, nature (2 wheels);Cane - single point      Prior Function Prior Level of Function : Independent/Modified Independent                     Extremity/Trunk Assessment   Upper Extremity Assessment Upper Extremity Assessment: Overall WFL for tasks assessed    Lower Extremity Assessment Lower Extremity Assessment: Overall WFL for tasks assessed;RLE deficits/detail RLE Deficits / Details: limited knee flex/ext secondary to acuity of sx; able to WB through extremity w/o buckling indicating at least 3+/5 strength RLE: Unable to fully assess due to pain RLE Sensation: WNL    Cervical / Trunk Assessment Cervical / Trunk Assessment: Normal  Communication   Communication: No difficulties  Cognition Arousal/Alertness: Awake/alert Behavior During Therapy: WFL for tasks assessed/performed Overall Cognitive Status: Within Functional Limits for tasks assessed                                 General Comments: A&O x4; able to follow 100% of multistep commands        General Comments General comments (skin integrity, edema, etc.): RLE dressed in bandage; no strikethrough noted    Exercises Other Exercises Other Exercises: Bed mobility, transfers, pre-gait activities, and gait in RW. Educated re: PT role/POC, DC recommendations, safety with mobility, polar care management, RLE WBAT, PM session. He verbalized understanding.   Assessment/Plan    PT Assessment Patient needs continued PT services  PT Problem List Decreased strength;Decreased range of motion;Decreased activity tolerance;Decreased balance;Decreased mobility;Pain       PT Treatment Interventions DME instruction;Gait training;Stair training;Functional mobility training;Therapeutic activities;Therapeutic exercise;Balance training;Neuromuscular re-education;Patient/family education    PT Goals (Current goals can be found in the Care Plan section)  Acute Rehab PT Goals Patient Stated Goal: "play  golf" PT Goal Formulation: With patient Time For Goal Achievement: 06/19/21 Potential to Achieve Goals: Fair    Frequency BID        AM-PAC PT "6 Clicks" Mobility  Outcome Measure Help needed turning from your back to your side while in a flat bed without using bedrails?: None Help needed moving from lying on your back to sitting on the side of a flat bed without using bedrails?: None Help needed moving to and from a bed to a chair (including a wheelchair)?: A Little Help needed standing up from a chair using your arms (e.g., wheelchair or bedside chair)?: A Little Help needed to walk in hospital room?: A Little Help needed climbing 3-5 steps with a railing? : A Little 6 Click Score: 20    End of Session Equipment Utilized During Treatment: Gait belt Activity Tolerance: Patient tolerated treatment well Patient left: in chair;with chair alarm set Nurse Communication: Mobility status PT Visit Diagnosis: Unsteadiness on feet (R26.81);Muscle weakness (generalized) (M62.81);Pain Pain - Right/Left: Right Pain - part of body: Knee    Time: 5956-3875 PT Time Calculation (min) (ACUTE ONLY): 31 min   Charges:   PT Evaluation $PT Eval Low Complexity: 1 Low PT Treatments $Gait Training: 8-22 mins        Herminio Commons, PT, DPT 1:08 PM,06/05/21

## 2021-06-05 NOTE — Plan of Care (Signed)
°  Problem: Education: Goal: Knowledge of General Education information will improve Description: Including pain rating scale, medication(s)/side effects and non-pharmacologic comfort measures Outcome: Progressing   Problem: Clinical Measurements: Goal: Ability to maintain clinical measurements within normal limits will improve Outcome: Progressing   Problem: Clinical Measurements: Goal: Will remain free from infection Outcome: Progressing   Problem: Clinical Measurements: Goal: Respiratory complications will improve Outcome: Progressing   Problem: Coping: Goal: Level of anxiety will decrease Outcome: Progressing   Problem: Elimination: Goal: Will not experience complications related to bowel motility Outcome: Progressing   Problem: Safety: Goal: Ability to remain free from injury will improve Outcome: Progressing

## 2021-06-05 NOTE — Discharge Summary (Signed)
Physician Discharge Summary  Patient ID: Derek Joseph MRN: [id number] DOB/AGE: November 21, 1965 56 y.o.  Admit date: 06/04/2021 Discharge date: 06/05/2021  Admission Diagnoses:  Total knee replacement status [Z96.659]  Surgeries: PROCEDURE:  Right total knee arthroplasty using computer-assisted navigation   SURGEON:  Marciano Sequin. M.D.   ANESTHESIA: spinal   ESTIMATED BLOOD LOSS: 50 mL   FLUIDS REPLACED: 1200 mL of crystalloid   TOURNIQUET TIME: 101 minutes   DRAINS: 2 medium Hemovac drains   SOFT TISSUE RELEASES: Anterior cruciate ligament, posterior cruciate ligament, deep medial collateral ligament, patellofemoral ligament   IMPLANTS UTILIZED: DePuy Attune size 7 posterior stabilized femoral component (cemented), size 7 rotating platform tibial component (cemented), 41 mm medialized dome patella (cemented), and a 5 mm stabilized rotating platform polyethylene insert.  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   Diverticulosis 05/06/2021   Morbid obesity ([place])    Colon polyps 04/26/2019   Status post total left knee replacement 17/79/3903   Umbilical hernia 00/92/3300   Morbid obesity with BMI of 40.0-44.9, adult ([place]) 10/24/2018   Primary osteoarthritis of right knee 10/24/2018   Primary osteoarthritis of right ankle 10/24/2018    Past Medical History:  Diagnosis Date   Arthritis    Lab test positive for detection of COVID-19 virus 08/06/2019   Morbid obesity ([place])    Osteoarthritis      Transfusion:    Consultants (if any):   Discharged Condition: Improved  Hospital Course: Derek Joseph is an 56 y.o. male who was admitted 06/04/2021 with a diagnosis of right knee osteoarthritis and went to the operating room on 06/04/2021 and underwent right total knee arthroplasty. The patient received perioperative antibiotics for prophylaxis (see below). The patient tolerated the procedure well  and was transported to PACU in stable condition. After meeting PACU criteria, the patient was subsequently transferred to the Orthopaedics/Rehabilitation unit.   The patient received DVT prophylaxis in the form of early mobilization, Lovenox, TED hose, and SCDs . A sacral pad had been placed and heels were elevated off of the bed with rolled towels in order to protect skin integrity. Foley catheter was discontinued on postoperative day #0. Wound drains were discontinued on postoperative day #1. The surgical incision was healing well without signs of infection.  Physical therapy was initiated postoperatively for transfers, gait training, and strengthening. Occupational therapy was initiated for activities of daily living and evaluation for assisted devices. Rehabilitation goals were reviewed in detail with the patient. The patient made steady progress with physical therapy and physical therapy recommended discharge to Home.   The patient achieved the preliminary goals of this hospitalization and was felt to be medically and orthopaedically appropriate for discharge.  He was given perioperative antibiotics:  Anti-infectives (From admission, onward)    Start     Dose/Rate Route Frequency Ordered Stop   06/04/21 1900  ceFAZolin (ANCEF) IVPB 2g/100 mL premix        2 g 200 mL/hr over 30 Minutes Intravenous Every 6 hours 06/04/21 1810 06/05/21 0050   06/04/21 1010  ceFAZolin (ANCEF) 2-4 GM/100ML-% IVPB  Status:  Discontinued       Note to Pharmacy: Jordan Hawks H: cabinet override      06/04/21 1010 06/04/21 1016   06/04/21 0600  ceFAZolin (ANCEF) IVPB 3g/100 mL premix        3 g 200 mL/hr over 30 Minutes Intravenous On call to O.R. 06/04/21 0143 06/04/21  1318     .  Recent vital signs:  Vitals:   06/05/21 0539 06/05/21 0757  BP: 123/62 (!) 148/91  Pulse: 61 (!) 58  Resp: 19 18  Temp:  97.7 F (36.5 C)  SpO2: 99% 96%    Recent laboratory studies:  No results for input(s): WBC,  HGB, HCT, PLT, K, CL, CO2, BUN, CREATININE, GLUCOSE, CALCIUM, LABPT, INR in the last 72 hours.  Diagnostic Studies: DG Knee Right Port  Result Date: 06/04/2021 CLINICAL DATA:  Status post total right knee arthroplasty. EXAM: PORTABLE RIGHT KNEE - 1-2 VIEW COMPARISON:  None. FINDINGS: Status post total right knee arthroplasty. No perihardware lucency is seen to indicate hardware failure or loosening. A surgical drain overlies the suprapatellar joint. Anterior knee surgical skin staples. Mild subcutaneous air. No acute fracture or dislocation. Old screw tracts within the proximal tibial metadiaphysis. IMPRESSION: Status post recent total right knee arthroplasty without evidence of hardware failure or acute fracture. Electronically Signed   By: Yvonne Kendall M.D.   On: 06/04/2021 16:57    Discharge Medications:   Allergies as of 06/05/2021       Reactions   Codeine    Constipation: immediate and severe        Medication List     TAKE these medications    celecoxib 200 MG capsule Commonly known as: CELEBREX Take 1 capsule (200 mg total) by mouth 2 (two) times daily.   enoxaparin 40 MG/0.4ML injection Commonly known as: LOVENOX Inject 0.4 mLs (40 mg total) into the skin daily for 14 days.   lovastatin 20 MG tablet Commonly known as: MEVACOR Take 20 mg by mouth daily after supper.   ondansetron 4 MG tablet Commonly known as: ZOFRAN Take 1 tablet (4 mg total) by mouth every 6 (six) hours as needed for nausea.   oxyCODONE 5 MG immediate release tablet Commonly known as: Oxy IR/ROXICODONE Take 1 tablet (5 mg total) by mouth every 4 (four) hours as needed for severe pain.   traMADol 50 MG tablet Commonly known as: ULTRAM Take 1 tablet (50 mg total) by mouth every 4 (four) hours as needed for moderate pain. What changed:  when to take this reasons to take this               Durable Medical Equipment  (From admission, onward)           Start     Ordered   06/04/21  1810  DME Walker rolling  Once       Question:  Patient needs a walker to treat with the following condition  Answer:  Total knee replacement status   06/04/21 1810   06/04/21 1810  DME Bedside commode  Once       Question:  Patient needs a bedside commode to treat with the following condition  Answer:  Total knee replacement status   06/04/21 1810           Disposition: Home with home health PT     Follow-up Information     Fausto Skillern, PA-C Follow up in 14 day(s).   Specialty: Orthopedic Surgery Why: Staple removal. Contact information: [place] [address] [phone]                  J. Cameron Proud, PA-C 06/05/2021, 9:53 AM

## 2021-06-05 NOTE — Evaluation (Signed)
Occupational Therapy Evaluation Patient Details Name: Derek Joseph MRN: [id number] DOB: [birth year] Today's Date: 06/05/2021   History of Present Illness Derek Joseph is a 56 y.o. year old male with a long history of progressive knee pain, presenting for elective R total knee arthroplasty on 06/04/2021. PMHx includes L TKA (2021), OA, and obesity.   Clinical Impression   Pt seen for OT evaluation this date, POD#1 from above surgery. Pt was independent in all ADLs prior to surgery. Pt is eager to return to PLOF with less pain and improved safety and independence. Pt is Mod I in transfers and ambulation using RW, reports 4/10 R knee pain, and currently requires minimal assist for LB dressing while in seated position due to pain and limited AROM of R knee. Pt and spouse instructed in polar care mgt, falls prevention strategies, home/routines modifications, DME/AE for LB bathing and dressing tasks, and compression stocking mgt. They  verbalize understanding. Given that this is pt's 2nd TKA, pt and family report they understand and feel well equipped to meet post-surgical care needs. Wife works from home and can be available any time, as needed. Pt will pursue HH and outpt PT but do not currently anticipate any OT needs following this hospitalization. Pt will need 3-in-1 BSC at discharge; already has RW and SPC at home.        Recommendations for follow up therapy are one component of a multi-disciplinary discharge planning process, led by the attending physician.  Recommendations may be updated based on patient status, additional functional criteria and insurance authorization.   Follow Up Recommendations  No OT follow up    Assistance Recommended at Discharge Intermittent Supervision/Assistance  Patient can return home with the following A little help with walking and/or transfers;A little help with bathing/dressing/bathroom;Assist for transportation    Functional Status Assessment  Patient  has had a recent decline in their functional status and demonstrates the ability to make significant improvements in function in a reasonable and predictable amount of time.  Equipment Recommendations  BSC/3in1    Recommendations for Other Services       Precautions / Restrictions Precautions Precautions: Fall Precaution Comments: mod fall risk Restrictions Weight Bearing Restrictions: Yes RLE Weight Bearing: Weight bearing as tolerated      Mobility Bed Mobility               General bed mobility comments: pt received, left in recliner    Transfers Overall transfer level: Needs assistance Equipment used: Rolling walker (2 wheels) Transfers: Sit to/from Stand Sit to Stand: Modified independent (Device/Increase time)                  Balance Overall balance assessment: Modified Independent                                         ADL either performed or assessed with clinical judgement   ADL Overall ADL's : Needs assistance/impaired Eating/Feeding: Independent                   Lower Body Dressing: Moderate assistance                       Vision Patient Visual Report: No change from baseline       Perception     Praxis      Pertinent Vitals/Pain Pain Assessment Pain Assessment:  0-10 Pain Score: 4  Pain Descriptors / Indicators: Aching Pain Intervention(s): Limited activity within patient's tolerance, Repositioned, Monitored during session, Ice applied, Premedicated before session     Hand Dominance     Extremity/Trunk Assessment Upper Extremity Assessment Upper Extremity Assessment: Overall WFL for tasks assessed   Lower Extremity Assessment Lower Extremity Assessment: RLE deficits/detail RLE Deficits / Details: s/p R TKA; limited ROM, 4/10 pain RLE: Unable to fully assess due to pain RLE Sensation: WNL   Cervical / Trunk Assessment Cervical / Trunk Assessment: Normal   Communication  Communication Communication: No difficulties   Cognition Arousal/Alertness: Awake/alert Behavior During Therapy: WFL for tasks assessed/performed Overall Cognitive Status: Within Functional Limits for tasks assessed                                       General Comments       Exercises Other Exercises Other Exercises: Educ re: polar care mgmt, AE for LB dressing   Shoulder Instructions      Home Living Family/patient expects to be discharged to:: Private residence Living Arrangements: Spouse/significant other Available Help at Discharge: Family Type of Home: House Home Access: Stairs to enter Technical brewer of Steps: 5-6 Entrance Stairs-Rails: Right;Left Home Layout: One level     Bathroom Shower/Tub: Teacher, early years/pre: Standard Bathroom Accessibility: No   Home Equipment: Conservation officer, nature (2 wheels);Cane - single point          Prior Functioning/Environment Prior Level of Function : Independent/Modified Independent                        OT Problem List: Decreased range of motion;Decreased strength;Decreased activity tolerance;Decreased knowledge of use of DME or AE;Pain      OT Treatment/Interventions:      OT Goals(Current goals can be found in the care plan section) Acute Rehab OT Goals Patient Stated Goal: to have 2 pain-free knees OT Goal Formulation: With patient Time For Goal Achievement: 06/19/21 Potential to Achieve Goals: Good  OT Frequency:      Co-evaluation              AM-PAC OT "6 Clicks" Daily Activity     Outcome Measure Help from another person eating meals?: None Help from another person taking care of personal grooming?: None Help from another person toileting, which includes using toliet, bedpan, or urinal?: None Help from another person bathing (including washing, rinsing, drying)?: A Lot Help from another person to put on and taking off regular upper body clothing?: None Help  from another person to put on and taking off regular lower body clothing?: A Lot 6 Click Score: 20   End of Session Equipment Utilized During Treatment: Rolling walker (2 wheels)  Activity Tolerance: Patient tolerated treatment well;No increased pain Patient left: in chair;with family/visitor present;with call bell/phone within reach  OT Visit Diagnosis: Other abnormalities of gait and mobility (R26.89);Pain Pain - Right/Left: Right Pain - part of body: Knee                Time: 4585-9292 OT Time Calculation (min): 20 min Charges:  OT General Charges $OT Visit: 1 Visit OT Evaluation $OT Eval Low Complexity: 1 Low OT Treatments $Self Care/Home Management : 8-22 mins Josiah Lobo, PhD, MS, OTR/L 06/05/21, 11:44 AM

## 2021-06-05 NOTE — Progress Notes (Signed)
°  Subjective: 1 Day Post-Op Procedure(s) (LRB): COMPUTER ASSISTED TOTAL KNEE ARTHROPLASTY (Right) Patient reports pain as mild.   Patient is well, and has had no acute complaints or problems Plan is to go Home after hospital stay. Negative for chest pain and shortness of breath Fever: no Gastrointestinal:Negative for nausea and vomiting Patient is passing gas, has not had a BM yet.  Objective: Vital signs in last 24 hours: Temp:  [97.2 F (36.2 C)-98.2 F (36.8 C)] 97.7 F (36.5 C) (02/25 0757) Pulse Rate:  [58-79] 58 (02/25 0757) Resp:  [12-20] 18 (02/25 0757) BP: (116-148)/(62-100) 148/91 (02/25 0757) SpO2:  [94 %-100 %] 96 % (02/25 0757) Weight:  [124.3 kg] 124.3 kg (02/24 1024)  Intake/Output from previous day:  Intake/Output Summary (Last 24 hours) at 06/05/2021 0949 Last data filed at 06/05/2021 0542 Gross per 24 hour  Intake 1740 ml  Output 1135 ml  Net 605 ml    Intake/Output this shift: No intake/output data recorded.  Labs: No results for input(s): HGB in the last 72 hours. No results for input(s): WBC, RBC, HCT, PLT in the last 72 hours. No results for input(s): NA, K, CL, CO2, BUN, CREATININE, GLUCOSE, CALCIUM in the last 72 hours. No results for input(s): LABPT, INR in the last 72 hours.   EXAM General - Patient is Alert, Appropriate, and Oriented Extremity - ABD soft Neurovascular intact Dorsiflexion/Plantar flexion intact Incision: dressing C/D/I No cellulitis present Dressing/Incision - Bulky dressing removed from the right leg, hemovac removed without difficulty.  Honeycomb with very mild bloody drainage noted.  4x4 with tegaderm applied to the drain site. Motor Function - intact, moving foot and toes well on exam.  Abdomen soft with normal bowel sounds this morning.  Past Medical History:  Diagnosis Date   Arthritis    Lab test positive for detection of COVID-19 virus 08/06/2019   Morbid obesity ([place])    Osteoarthritis      Assessment/Plan: 1 Day Post-Op Procedure(s) (LRB): COMPUTER ASSISTED TOTAL KNEE ARTHROPLASTY (Right) Principal Problem:   Total knee replacement status  Estimated body mass index is 42.91 kg/m as calculated from the following:   Height as of this encounter: 5\' 7"  (1.702 m).   Weight as of this encounter: 124.3 kg. Advance diet Up with therapy D/C IV fluids when tolerating po intake.  Vitals reviewed this AM. Patient is passing gas without pain. Hemovac removed today, bulky dressing removed. Up with therapy today.  Plan for discharge home today pending progress with therapy.  DVT Prophylaxis - Lovenox, TED hose, and SCDs Weight-Bearing as tolerated to right leg  J. Cameron Proud, PA-C [facility] Orthopaedic Surgery 06/05/2021, 9:49 AM

## 2021-06-05 NOTE — Progress Notes (Signed)
CSW contacted Jasmine from [employer] and patients BSC/3in1 will be delivered to the room prior to discharge

## 2021-06-05 NOTE — Progress Notes (Signed)
Patient discharge instructions gone over and patient verbally expressed understanding. IV was taken out. All belongings sent with patient sent with patient. Patient wheeled to medical mall by staff.

## 2021-06-05 NOTE — Progress Notes (Signed)
Physical Therapy Treatment Patient Details Name: Derek Joseph MRN: [id number] DOB: 14-Jul-1965 Today's Date: 06/05/2021   History of Present Illness Pt admitted to [facility] on 06/04/21 under observation for elective R TKA. Significant PMH includes: OA and morbid obesity.    PT Comments    Pt was sitting in recliner upon arriving. He agrees to session and is cooperative and pleasant throughout. Easily able to stand and ambulate with use of RW. Demonstrated safe ability to ascend/descend 4 stair with BUE support on +2 rails. No safety concerns throughout session. Recommend HHPT to address ROM and strength deficits. Cleared form an acute PT standpoint for safe DC home with continued skilled PT.   Recommendations for follow up therapy are one component of a multi-disciplinary discharge planning process, led by the attending physician.  Recommendations may be updated based on patient status, additional functional criteria and insurance authorization.  Follow Up Recommendations  Home health PT     Assistance Recommended at Discharge PRN  Patient can return home with the following Assist for transportation   Equipment Recommendations  BSC/3in1       Precautions / Restrictions Precautions Precautions: Fall Precaution Comments: mod fall risk Restrictions Weight Bearing Restrictions: Yes RLE Weight Bearing: Weight bearing as tolerated     Mobility  Bed Mobility Overal bed mobility: Modified Independent     Transfers Overall transfer level: Modified independent Equipment used: Rolling walker (2 wheels) Transfers: Sit to/from Stand Sit to Stand: Modified independent (Device/Increase time)     Ambulation/Gait Ambulation/Gait assistance: Modified independent (Device/Increase time) Gait Distance (Feet): 200 Feet Assistive device: Rolling walker (2 wheels) Gait Pattern/deviations: Step-through pattern Gait velocity: decreased     General Gait Details: no LOB or safety concern with  ambulation with RW   Stairs Stairs: Yes Stairs assistance: Supervision Stair Management: Two rails, Step to pattern, Forwards Number of Stairs: 4 General stair comments: Pt was able to safely ambulate up/down 4 stair with BUE support on rails. No LOB or safety concern     Balance Overall balance assessment: Needs assistance Sitting-balance support: Bilateral upper extremity supported, No upper extremity supported, Feet supported Sitting balance-Leahy Scale: Normal     Standing balance support: During functional activity, Bilateral upper extremity supported Standing balance-Leahy Scale: Good Standing balance comment: in RW         Cognition Arousal/Alertness: Awake/alert Behavior During Therapy: WFL for tasks assessed/performed Overall Cognitive Status: Within Functional Limits for tasks assessed      General Comments: A&O x4; able to follow 100% of multistep commands           General Comments General comments (skin integrity, edema, etc.): reviewed HEP handout and pt demonstrates understanding.      Pertinent Vitals/Pain Pain Assessment Pain Assessment: 0-10 Pain Score: 4  Pain Location: R knee Pain Descriptors / Indicators: Aching Pain Intervention(s): Limited activity within patient's tolerance, Monitored during session, Premedicated before session, Repositioned, Ice applied    Home Living Family/patient expects to be discharged to:: Private residence Living Arrangements: Spouse/significant other Available Help at Discharge: Family Type of Home: House Home Access: Stairs to enter Entrance Stairs-Rails: Psychiatric nurse of Steps: 5-6   Home Layout: One level Home Equipment: Conservation officer, nature (2 wheels);Cane - single point          PT Goals (current goals can now be found in the care plan section) Acute Rehab PT Goals Patient Stated Goal: go home PT Goal Formulation: With patient Time For Goal Achievement: 06/19/21 Potential to Achieve  Goals: Fair Progress towards PT goals: Progressing toward goals    Frequency    BID      PT Plan Current plan remains appropriate       AM-PAC PT "6 Clicks" Mobility   Outcome Measure  Help needed turning from your back to your side while in a flat bed without using bedrails?: None Help needed moving from lying on your back to sitting on the side of a flat bed without using bedrails?: None Help needed moving to and from a bed to a chair (including a wheelchair)?: A Little Help needed standing up from a chair using your arms (e.g., wheelchair or bedside chair)?: A Little Help needed to walk in hospital room?: A Little Help needed climbing 3-5 steps with a railing? : A Little 6 Click Score: 20    End of Session Equipment Utilized During Treatment: Gait belt Activity Tolerance: Patient tolerated treatment well Patient left: in chair;with chair alarm set Nurse Communication: Mobility status PT Visit Diagnosis: Unsteadiness on feet (R26.81);Muscle weakness (generalized) (M62.81);Pain Pain - Right/Left: Right Pain - part of body: Knee     Time: 6226-3335 PT Time Calculation (min) (ACUTE ONLY): 23 min  Charges:  $Gait Training: 8-22 mins $Therapeutic Exercise: 8-22 mins                     Julaine Fusi PTA 06/05/21, 3:24 PM

## 2021-06-05 NOTE — Anesthesia Postprocedure Evaluation (Signed)
Anesthesia Post Note  Patient: [name]  Procedure(s) Performed: COMPUTER ASSISTED TOTAL KNEE ARTHROPLASTY (Right: Knee)  Patient location during evaluation: Nursing Unit Anesthesia Type: Spinal Level of consciousness: oriented and awake and alert Pain management: pain level controlled Vital Signs Assessment: post-procedure vital signs reviewed and stable Respiratory status: spontaneous breathing and respiratory function stable Cardiovascular status: blood pressure returned to baseline and stable Postop Assessment: no headache, no backache, no apparent nausea or vomiting and patient able to bend at knees Anesthetic complications: no   No notable events documented.   Last Vitals:  Vitals:   06/05/21 0757 06/05/21 1144  BP: (!) 148/91 136/88  Pulse: (!) 58 70  Resp: 18 18  Temp: 36.5 C 36.6 C  SpO2: 96% 96%    Last Pain:  Vitals:   06/05/21 1144  TempSrc: Oral  PainSc:                  Martha Clan

## 2021-06-07 ENCOUNTER — Encounter: Payer: Self-pay | Admitting: Orthopedic Surgery

## 2021-11-17 ENCOUNTER — Encounter (INDEPENDENT_AMBULATORY_CARE_PROVIDER_SITE_OTHER): Payer: Self-pay

## 2021-12-02 ENCOUNTER — Other Ambulatory Visit: Payer: Self-pay | Admitting: General Surgery

## 2021-12-02 NOTE — Progress Notes (Addendum)
Progress Notes - documented in this encounter Derek Joseph, Geronimo Boot, MD - 07/27/2021 3:30 PM EDT Formatting of this note is different from the original. Images from the original note were not included. Subjective:   Patient ID: Derek Joseph is a 56 y.o. male.  HPI  The following portions of the patient's history were reviewed and updated as appropriate.  This a new patient is here today for: office visit. Here for evaluation of a possible umbilical hernia.referred by Thornton Dales NP.  He states the knot was smaller about 2-3 years ago. He states he has lost some weight so it is more noticeable. He does admit to some mild discomfort. He did go to the Ed 02-01-21 and was diagnosed with diverticulitis of the small bowel. This was his first episode and none since. Ct scan was taken at that time.  The patient reports he was evaluated by a physician at [facility] about a year ago but was told that they would not consider elective repair until he had lost weight down to 225 pounds.  The patient reports he has lost about 35-40 pounds with diet, intermittent fasting and improved activity levels.  He just had a right total knee replacement June 04, 2021 with Dr. Marry Guan and has made an excellent recovery. 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 He is her with his wife, [name].  Review of Systems  Constitutional: Negative for chills and fever.  Respiratory: Negative for cough.     Chief Complaint  Patient presents with   New Patient    BP 126/80  Pulse 83  Temp 36.9 C (98.5 F)  Ht 170.2 cm (_0 )  Wt (!) 129.3 kg (285 lb)  SpO2 97%  BMI 44.64 kg/m   Past Medical History:  Diagnosis Date   Arthritis   Colon polyps 04/26/2019   Diverticulitis 01/2021   Morbid obesity with BMI of 40.0-44.9, adult (CMS-HCC) 10/24/2018   Osteoarthritis   Primary osteoarthritis of right ankle 10/24/2018   Status post total  left knee replacement 28/78/6767   Umbilical hernia 20/94/7096    Past Surgical History:  Procedure Laterality Date   APPENDECTOMY 2000   COLONOSCOPY W/REMOVAL LESIONS BY SNARE 01/07/2016  Procedure: COLONOSCOPY, FLEXIBLE; WITH REMOVAL OF TUMOR(S), POLYP(S), OR OTHER LESION(S) BY SNARE TECHNIQUE; Surgeon: Cora Daniels, MD; Location[facility] ENDO/BRON[facility]; Service: Gastroenterology;;   COLONOSCOPY W/BIOPSY 01/07/2016  Dr. Jerilynn Mages. Feiler @ [place] Adenomatous Polyps, rpt 3 yrs per provider   Left total knee arthroplasty using computer-assisted navigation 02/20/2019  Dr Marry Guan   COLONOSCOPY 10/21/2019  (4) Tubular adenoma of the colon CBF 10/2022   Right total knee arthroplasty using computer-assisted navigation 06/04/2021  Dr Marry Guan   HERNIA REPAIR   TONSILLECTOMY    Social History   Socioeconomic History   Marital status: Married  Spouse name: [name]   Number of children: 1   Years of education: 42  Occupational History   Occupation: Full-time-Inventory Control  Tobacco Use   Smoking status: Former  Packs/day: 1.50  Years: 35.00  Pack years: 52.50  Types: Cigarettes  Quit date: 01/2013  Years since quitting: 8.5   Smokeless tobacco: Never  Vaping Use   Vaping Use: Never used  Substance and Sexual Activity   Alcohol use: Yes  Comment: occassionally   Drug use: Yes  Types: Marijuana  Comment: every now and then   Sexual activity: Yes  Partners: Female  Social History Narrative  Married, 1 son  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[employer]  Former smoker 1ppd x 34yr, quit 2014  Rare etoh  Occasional marijuana  Caffeine 3 cups coffee daily  Golf most weeks    Allergies  Allergen Reactions   Codeine Abdominal Pain  Constipation: immediate and severe   Current Outpatient Medications  Medication Sig Dispense Refill   acetaminophen (TYLENOL) 325 MG tablet Take 650 mg by mouth every 4 (four) hours as needed for Pain   amoxicillin (AMOXIL) 500 MG  capsule Take 4 capsules 1 hour before the dental procedure 4 capsule 2   celecoxib (CELEBREX) 200 MG capsule Take 1 capsule (200 mg total) by mouth 2 (two) times daily as needed for Pain 60 capsule 1   Lactobac no.41/Bifidobact no.7 (PROBIOTIC-10 ORAL) Take by mouth once daily   lovastatin (MEVACOR) 20 MG tablet TAKE 1 TABLET BY MOUTH ONCE DAILY WITH DINNER 90 tablet 0   No current facility-administered medications for this visit.   Family History  Problem Relation Age of Onset   Coronary Artery Disease (Blocked arteries around heart) Mother 653 MI, died   Bipolar disorder Mother   Depression Mother   Skin cancer Mother  melanoma   Asthma Son  as a young child   Colon cancer Maternal Aunt   No Known Problems Father   Colon polyps Sister   No Known Problems Brother   Coronary Artery Disease (Blocked arteries around heart) Paternal Grandmother   Diverticulitis Sister   Labs and Radiology:   CT of the abdomen and pelvis [facility]:  EXAM: CT ABDOMEN PELVIS W CONTRAST DATE: 02/01/2021 10:01 AM ACCESSION: [id number] DICTATED: 02/01/2021 10:16 AM [place] CLINICAL INDICATION: 55years old with Acute onset LLQ abdominal pain. Increase in size of umbilical hernia. ; LLQ abdominal pain   COMPARISON: None  TECHNIQUE: A helical CT scan of the abdomen and pelvis was obtained following IV contrast from the lung bases through the pubic symphysis. Images were reconstructed in the axial plane. Coronal and sagittal reformatted images were also provided for further evaluation.   FINDINGS:   LOWER CHEST: Unremarkable.  LIVER: Normal liver contour. Coarse calcification at the right hepatic dome. No focal liver lesions.   BILIARY: No biliary ductal dilatation. The gallbladder is normal in appearance.  SPLEEN: Normal in size and contour. Small splenule.  PANCREAS: Normal pancreatic contour. No focal lesions. No ductal dilation.  ADRENAL GLANDS: Normal  appearance of the adrenal glands.  KIDNEYS/URETERS: Symmetric renal enhancement. No hydronephrosis. Partially duplicated left renal collecting system.  BLADDER: Unremarkable.  REPRODUCTIVE ORGANS: Coarse prostatic calcification.  GI TRACT: Focal thick-walled, air-filled outpouching adjacent to the jejunum in the left midabdomen with mild wall thickening and surrounding stranding (3:72). Colonic diverticulosis without CT evidence of acute diverticulitis. The appendix is not visualized however there is no focal inflammation in the right lower quadrant to suggest acute appendicitis.. No evidence of bowel obstruction.  PERITONEUM, RETROPERITONEUM AND MESENTERY: No free air. No ascites. Mild mesenteric stranding.  LYMPH NODES: Multiple prominent yet subcentimeter mesenteric lymph nodes, potentially reactive.  VESSELS: Hepatic and portal veins are patent. Normal caliber aorta. Scattered atherosclerotic calcifications.  BONES and SOFT TISSUES: No aggressive osseous lesions. Moderate sized fat-containing umbilical hernia without associated inflammatory changes.    IMPRESSION: --Mild inflammatory changes surrounding a thick-walled focal air-filled outpouching adjacent to the jejunum in left midabdomen, which could be seen in the setting of acute jejunal diverticulitis. No free  air to suggest perforation or abscess. --Moderate size fat-containing umbilical hernia without associated inflammatory changes or bowel involvement. --Additional chronic and incidental findings as above.  These films were independently reviewed.  May 06, 2021 laboratory:  Hemoglobin A1C 4.2 - 5.6 % 5.4   Glucose 70 - 110 mg/dL 75  Sodium 136 - 145 mmol/L 137  Potassium 3.6 - 5.1 mmol/L 4.8  Chloride 97 - 109 mmol/L 101  Carbon Dioxide (CO2) 22.0 - 32.0 mmol/L 28.7  Urea Nitrogen (BUN) 7 - 25 mg/dL 17  Creatinine 0.7 - 1.3 mg/dL 0.9  Glomerular Filtration Rate (eGFR), MDRD Estimate >60 mL/min/1.73sq m 88   Calcium 8.7 - 10.3 mg/dL 9.5  AST 8 - 39 U/L 18  ALT 6 - 57 U/L 22  Alk Phos (alkaline Phosphatase) 34 - 104 U/L 106 High   Albumin 3.5 - 4.8 g/dL 4.6  Bilirubin, Total 0.3 - 1.2 mg/dL 0.7  Protein, Total 6.1 - 7.9 g/dL 7.5  A/G Ratio 1.0 - 5.0 gm/dL 1.6   WBC (White Blood Cell Count) 4.1 - 10.2 10^3/uL 6.2  RBC (Red Blood Cell Count) 4.69 - 6.13 10^6/uL 5.27  Hemoglobin 14.1 - 18.1 gm/dL 16.1  Hematocrit 40.0 - 52.0 % 46.6  MCV (Mean Corpuscular Volume) 80.0 - 100.0 fl 88.4  MCH (Mean Corpuscular Hemoglobin) 27.0 - 31.2 pg 30.6  MCHC (Mean Corpuscular Hemoglobin Concentration) 32.0 - 36.0 gm/dL 34.5  Platelet Count 150 - 450 10^3/uL 168  RDW-CV (Red Cell Distribution Width) 11.6 - 14.8 % 12.4  MPV (Mean Platelet Volume) 9.4 - 12.4 fl 10.9  Neutrophils 1.50 - 7.80 10^3/uL 3.40  Lymphocytes 1.00 - 3.60 10^3/uL 2.10  Mixed Count 0.10 - 0.90 10^3/uL 0.70  Neutrophil % 32.0 - 70.0 % 55.9  Lymphocyte % 10.0 - 50.0 % 33.6  Mixed % 3.0 - 14.4 % 10.5   Colonoscopy October 21, 2019: A.  COLON POLYP X 2, TRANSVERSE; COLD BIOPSY:  - TUBULAR ADENOMAS, 4 FRAGMENTS.  - NEGATIVE FOR HIGH-GRADE DYSPLASIA AND MALIGNANCY.   B.  COLON POLYP, SPLENIC FLEXURE; COLD BIOPSY:  - TUBULAR ADENOMA.  - NEGATIVE FOR HIGH-GRADE DYSPLASIA AND MALIGNANCY.   C.  COLON POLYP, SIGMOID; COLD BIOPSY:  - TUBULAR ADENOMA.  - NEGATIVE FOR HIGH-GRADE DYSPLASIA AND MALIGNANCY.   Mild sigmoid diverticulosis was noted on that study.  Objective:  Physical Exam Constitutional:  Appearance: Normal appearance.  Cardiovascular:  Rate and Rhythm: Normal rate and regular rhythm.  Pulses: Normal pulses.  Heart sounds: Normal heart sounds.  Pulmonary:  Effort: Pulmonary effort is normal.  Breath sounds: Normal breath sounds.  Abdominal:  General: Abdomen is protuberant. Bowel sounds are normal.  Palpations: Abdomen is soft.   Comments: 6-7 cm bulge at the umbilicus with erythema of the overlying umbilical skin. No  tenderness. No active skin ulceration. It is not easily reducible.  Musculoskeletal:  Cervical back: Neck supple.  Skin: General: Skin is warm and dry.  Neurological:  Mental Status: He is alert and oriented to person, place, and time.  Psychiatric:  Mood and Affect: Mood normal.  Behavior: Behavior normal.    Assessment:   3 cm fascial defect at the umbilicus with incarcerated omental fat.  Excellent recovery from recent total knee replacement.  Excellent response to dietary modification with 35-40 pound weight loss reported.  Plan:   The patient is made a good recovery from his knee replacement and is looking forward to getting back to work. 225 would be an ideal weight to consider surgery,  but not mandatory. His wife works in a [employer] manager hernia mesh complicated cases, we had an in-depth discussion of the role for mesh reinforcement for patient with a 3 cm fascial defect. Without mesh this is about a 60% recurrence rate with mesh probably more likely 10. He would likely be a candidate for a retrorectus mesh placement which would protect the viscera from contact with the mesh.  Anticipated return to work in 2-3 weeks with a modest weight limit for the first month or 6 weeks. He is well versed in proper lifting technique.  He will contact the office when he like to think about having surgery.   This note is partially prepared by Karie Fetch, RN, acting as a scribe in the presence of Dr. Hervey Ard, MD.  The documentation recorded by the scribe accurately reflects the service I personally performed and the decisions made by me.   Robert Bellow, MD FACS   Electronically signed by Mayer Masker, MD at 07/27/2021 7:35 PM EDT   December 30, 2021 exam:    Progress Notes - documented in this encounter Yarenis Cerino, Geronimo Boot, MD - 12/30/2021 3:00 PM EDT Formatting of this note is different from the original. Subjective:   Patient ID: Derek Joseph is a 56 y.o. male.  HPI  The following portions of the patient's history were reviewed and updated as appropriate.  This an established patient is here today for: office visit. Here for preop exam, ventral hernia repair on 01-10-22. He did have a ED visit at [facility] 12-27-21 and is on Omeprazole an Augmentin 875 mg.. The patient reported a week ago he began to have sharp pains all over his abdomen and then the seem to settle in the lower abdomen. He was seen at [facility] with a fairly unremarkable evaluation. He reports he had a tiny bit of nausea, but did not change his diet. No fever or chills. Mildly loose stools on 2 occasions. In the last 48-72 hours he is back to 90% of baseline.  He is here with his wife, [name].  Yes they have a global  Review of Systems  Constitutional: Negative for chills and fever.  Respiratory: Negative for cough.   Chief Complaint  Patient presents with  Pre-op Exam    BP 130/84  Pulse 68  Temp 36.7 C (98.1 F)  Ht 170.2 cm (_0 )  Wt (!) 128.8 kg (284 lb)  SpO2 98%  BMI 44.48 kg/m   Past Medical History:  Diagnosis Date  Arthritis  Colon polyps 04/26/2019  Diverticulitis 01/2021  Morbid obesity with BMI of 40.0-44.9, adult (CMS-HCC) 10/24/2018  Osteoarthritis  Primary osteoarthritis of right ankle 10/24/2018  Status post total left knee replacement 29/92/4268  Umbilical hernia 34/19/6222    Past Surgical History:  Procedure Laterality Date  APPENDECTOMY 2000  COLONOSCOPY W/REMOVAL LESIONS BY SNARE 01/07/2016  Procedure: COLONOSCOPY, FLEXIBLE; WITH REMOVAL OF TUMOR(S), POLYP(S), OR OTHER LESION(S) BY SNARE TECHNIQUE; Surgeon: Cora Daniels, MD; Location: [facility] ENDO/BRONCH; Service: Gastroenterology;;  COLONOSCOPY W/BIOPSY 01/07/2016  Dr. Jerilynn Mages. Feiler @ [place] Adenomatous Polyps, rpt 3 yrs per provider  Left total knee arthroplasty using computer-assisted navigation 02/20/2019  Dr Marry Guan  COLONOSCOPY 10/21/2019   (4) Tubular adenoma of the colon CBF 10/2022  Right total knee arthroplasty using computer-assisted navigation 06/04/2021  Dr Marry Guan  HERNIA REPAIR  TONSILLECTOMY    Social History   Socioeconomic History  Marital status: Married  Spouse name: [name]  Number  of children: 1  Years of education: 10  Occupational History  Occupation: Full-time-Inventory Control  Tobacco Use  Smoking status: Former  Packs/day: 1.50  Years: 35.00  Pack years: 52.50  Types: Cigarettes  Quit date: 01/2013  Years since quitting: 8.9  Smokeless tobacco: Never  Vaping Use  Vaping Use: Never used  Substance and Sexual Activity  Alcohol use: Yes  Comment: occassionally  Drug use: Yes  Types: Marijuana  Comment: every now and then  Sexual activity: Yes  Partners: Female  Social History Narrative  Married, 1 son  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, quit 2014  Rare etoh  Occasional marijuana  Caffeine 3 cups coffee daily  Golf most weeks    Allergies  Allergen Reactions  Codeine Abdominal Pain  Constipation: immediate and severe   Current Outpatient Medications  Medication Sig Dispense Refill  acetaminophen (TYLENOL) 325 MG tablet Take 650 mg by mouth every 4 (four) hours as needed for Pain  amoxicillin (AMOXIL) 500 MG capsule Take 4 capsules 1 hour before the dental procedure 4 capsule 2  amoxicillin-clavulanate (AUGMENTIN) 875-125 mg tablet Take 1 tablet (875 mg total) by mouth every 12 (twelve) hours for 10 days 20 tablet 0  celecoxib (CELEBREX) 200 MG capsule TAKE 1 CAPSULE BY MOUTH TWICE DAILY AS NEEDED FOR PAIN 60 capsule 0  Lactobac no.41/Bifidobact no.7 (PROBIOTIC-10 ORAL) Take by mouth once daily  lovastatin (MEVACOR) 20 MG tablet TAKE 1 TABLET BY MOUTH ONCE DAILY WITH SUPPER 90 tablet 2  omeprazole (PRILOSEC) 20 MG DR capsule Take 20 mg by mouth 2 (two) times daily before meals   No current facility-administered medications for this  visit.   Family History  Problem Relation Age of Onset  Coronary Artery Disease (Blocked arteries around heart) Mother 672 MI, died  Bipolar disorder Mother  Depression Mother  Skin cancer Mother  melanoma  Asthma Son  as a young child  Colon cancer Maternal Aunt  No Known Problems Father  Colon polyps Sister  No Known Problems Brother  Coronary Artery Disease (Blocked arteries around heart) Paternal Grandmother  Diverticulitis Sister   Labs and Radiology:   December 27, 2021 laboratory and imaging studies:  EXAM: CT ABDOMEN PELVIS W CONTRAST  DATE: 12/27/2021 1:37 PM  ACCESSION: [id number]  DICTATED: 12/27/2021 1:47 PM  [place]  CLINICAL INDICATION: 56years old with Diffuse abdominal pain. Most in left lower quadrant, history of diverticulitis. Also history of large umbilical hernia ; LLQ abdominal pain   COMPARISON: CT abdomen pelvis dated 02/01/2021   TECHNIQUE: A helical CT scan of the abdomen and pelvis was obtained following IV contrast from the lung bases through the pubic symphysis. Images were reconstructed in the axial plane. Coronal and sagittal reformatted images were also provided for further evaluation.   FINDINGS:   LOWER CHEST: Unremarkable.   LIVER: Normal liver contour. Unchanged coarse calcification at the right hepatic dome. Otherwise, no focal liver lesions.   BILIARY: Gallbladder is contracted. No biliary ductal dilatation.   SPLEEN: Small splenule. Otherwise, normal in size and contour.   PANCREAS: Normal pancreatic contour. No focal lesions. No ductal dilation.   ADRENAL GLANDS: Normal appearance of the adrenal glands.   KIDNEYS/URETERS: Symmetric renal enhancement. No hydronephrosis. No solid renal mass.   BLADDER: Unremarkable.   REPRODUCTIVE ORGANS: Unremarkable.   GI TRACT: No findings of bowel obstruction or acute inflammation. Colonic diverticulosis without evidence of diverticulitis.    PERITONEUM,  RETROPERITONEUM AND MESENTERY: No free air. No ascites. No fluid collection. There is hazy stranding and nodularity in the left central mesentery, nonspecific, but can be seen in the setting of sclerosing mesenteritis. No enlarged lymph nodes.   LYMPH NODES: No adenopathy.   VESSELS: Hepatic and portal veins are patent. Normal caliber aorta.   BONES and SOFT TISSUES: No aggressive osseous lesions. Large fat-containing umbilical hernia. Degenerative change of the bilateral hips and SI joints. Multilevel bilateral facet arthropathy.   This study was independently reviewed. The fat stranding is similar to that noted in October 2022 when the patient was diagnosed with diverticulitis of the jejunum. There was no inflammatory changes adjacent to the small intestine.  The midline fascial defect is approximately 3 cm in diameter.  Color, UA Colorless  Clarity, UA Clear  Specific Gravity, UA 1.003 - 1.030 1.008  pH, UA 5.0 - 9.0 6.0  Leukocyte Esterase, UA Negative Negative  Nitrite, UA Negative Negative  Protein, UA Negative Negative  Glucose, UA Negative Negative  Ketones, UA Negative Negative  Urobilinogen, UA <2.0 mg/dL <2.0 mg/dL  Bilirubin, UA Negative Negative  Blood, UA Negative Negative  RBC, UA <=3 /HPF <1  WBC, UA <=2 /HPF <1  Squam Epithel, UA 0 - 5 /HPF <1  Bacteria, UA None Seen /HPF None Seen   WBC 3.6 - 11.2 10*9/L 5.0  RBC 4.26 - 5.60 10*12/L 5.10  HGB 12.9 - 16.5 g/dL 15.5  HCT 39.0 - 48.0 % 45.5  MCV 77.6 - 95.7 fL 89.2  MCH 25.9 - 32.4 pg 30.4  MCHC 32.0 - 36.0 g/dL 34.0  RDW 12.2 - 15.2 % 13.3  MPV 6.8 - 10.7 fL 8.7  Platelet 150 - 450 10*9/L 160  nRBC <=4 /100 WBCs 0  Neutrophils % % 55.7  Lymphocytes % % 30.0  Monocytes % % 11.5  Eosinophils % % 1.7  Basophils % % 1.1  Absolute Neutrophils 1.8 - 7.8 10*9/L 2.8  Absolute Lymphocytes 1.1 - 3.6 10*9/L 1.5  Absolute Monocytes 0.3 - 0.8 10*9/L 0.6  Absolute Eosinophils 0.0 - 0.5 10*9/L 0.1   Absolute Basophils 0.0 - 0.1 10*9/L 0.1   Sodium 135 - 145 mmol/L 138  Potassium 3.4 - 4.8 mmol/L 4.3  Chloride 98 - 107 mmol/L 106  CO2 20.0 - 31.0 mmol/L 24.9  Anion Gap 5 - 14 mmol/L 7  BUN 9 - 23 mg/dL 8 Low  Creatinine 0.60 - 1.10 mg/dL 0.80  BUN/Creatinine Ratio 10  eGFR CKD-EPI (2021) Male >=60 mL/min/1.53m >90 eGFR calculated with CKD-EPI 2021 equation in accordance with [employer] [place] Ne[employer] Task Force recommendations.  Glucose 70 - 179 mg/dL 100  Calcium 8.7 - 10.4 mg/dL 9.2  Albumin 3.4 - 5.0 g/dL 4.0  Total Protein 5.7 - 8.2 g/dL 7.1  Total Bilirubin 0.3 - 1.2 mg/dL 0.5  AST <=34 U/L 18  ALT 10 - 49 U/L 19  Alkaline Phosphatase 46 - 116 U/L 126 High    Objective:  Physical Exam Constitutional:  Appearance: Normal appearance.  Cardiovascular:  Rate and Rhythm: Normal rate and regular rhythm.  Pulses: Normal pulses.  Heart sounds: Normal heart sounds.  Pulmonary:  Effort: Pulmonary effort is normal.  Breath sounds: Normal breath sounds.  Musculoskeletal:  Cervical back: Neck supple.  Skin: General: Skin is warm and dry.  Neurological:  Mental Status: He is alert and oriented to person, place, and time.  Psychiatric:  Mood and Affect: Mood normal.  Behavior: Behavior normal.  Assessment:   No evidence of recurrent jejunal diverticulitis.  Mild persistent mesentery prominence without evidence of leukocytosis or infection.  Ventral hernia with 3 cm defect.  No evidence of active infection.  Plan:   We will asked the patient to discontinue his antibiotic at this time considering no evidence of active infection. This will minimize the chance for colitis.  He is concerned about the possibility of constipation after surgery. He has been asked to make use of 1 capful of MiraLAX daily beginning 2 days prior to the procedure.  Indications for mesh repair were reviewed.  Based on the hernia size, without mesh 60%  chance of recurrence, significantly lower with the mesh. Risk of infection with mesh discussed with patient and his wife.  We will plan to proceed with ventral hernia repair as scheduled on January 10, 2022.   This note is partially prepared by Karie Fetch, RN, acting as a scribe in the presence of Dr. Hervey Ard, MD.  The documentation recorded by the scribe accurately reflects the service I personally performed and the decisions made by me.   Robert Bellow, MD FACS  Electronically signed by Mayer Masker, MD at 12/30/2021 4:04 PM EDT

## 2021-12-31 ENCOUNTER — Other Ambulatory Visit: Payer: Self-pay

## 2021-12-31 ENCOUNTER — Encounter
Admission: RE | Admit: 2021-12-31 | Discharge: 2021-12-31 | Disposition: A | Discharge status: Home / Self Care | Payer: BC Managed Care – PPO | Source: Ambulatory Visit | Attending: General Surgery | Admitting: General Surgery

## 2021-12-31 HISTORY — DX: Pneumonia, unspecified organism: J18.9

## 2021-12-31 HISTORY — DX: Gastro-esophageal reflux disease without esophagitis: K21.9

## 2021-12-31 NOTE — Patient Instructions (Addendum)
Your procedure is scheduled on: 01/10/22 - Monday Report to the Registration Desk on the 1st floor of the [facility]. To find out your arrival time, please call [phone] between 1PM - 3PM on: 01/07/22 - Friday If your arrival time is 6:00 am, do not arrive prior to that time as the [facility] entrance doors do not open until 6:00 am.   REMEMBER: Instructions that are not followed completely may result in serious medical risk, up to and including death; or upon the discretion of your surgeon and anesthesiologist your surgery may need to be rescheduled.  Do not eat food after midnight the night before surgery.  No gum chewing, lozengers or hard candies.  You may however, drink CLEAR liquids up to 2 hours before you are scheduled to arrive for your surgery. Do not drink anything within 2 hours of your scheduled arrival time.  Clear liquids include: - water  - apple juice without pulp - gatorade (not RED colors) - black coffee or tea (Do NOT add milk or creamers to the coffee or tea) Do NOT drink anything that is not on this list.  TAKE THESE MEDICATIONS THE MORNING OF SURGERY WITH A SIP OF WATER:  - celecoxib (CELEBREX) - omeprazole (PRILOSEC)- (take one the night before and one on the morning of surgery - helps to prevent nausea after surgery.)  One week prior to surgery: Stop Anti-inflammatories (NSAIDS) such as Advil, Aleve, Ibuprofen, Motrin, Naproxen, Naprosyn and Aspirin based products such as Excedrin, Goodys Powder, BC Powder.  Stop ANY OVER THE COUNTER supplements until after surgery.  You may however, continue to take Tylenol if needed for pain up until the day of surgery.  No Alcohol for 24 hours before or after surgery.  No Smoking including e-cigarettes for 24 hours prior to surgery.  No chewable tobacco products for at least 6 hours prior to surgery.  No nicotine patches on the day of surgery.  Do not use any "recreational" drugs for at least a week prior to  your surgery.  Please be advised that the combination of cocaine and anesthesia may have negative outcomes, up to and including death. If you test positive for cocaine, your surgery will be cancelled.  On the morning of surgery brush your teeth with toothpaste and water, you may rinse your mouth with mouthwash if you wish. Do not swallow any toothpaste or mouthwash.  Use CHG Soap or wipes as directed on instruction sheet.  Do not wear jewelry, make-up, hairpins, clips or nail polish.  Do not wear lotions, powders, or perfumes.   Do not shave body from the neck down 48 hours prior to surgery just in case you cut yourself which could leave a site for infection.  Also, freshly shaved skin may become irritated if using the CHG soap.  Contact lenses, hearing aids and dentures may not be worn into surgery.  Do not bring valuables to the hospital. [facility] is not responsible for any missing/lost belongings or valuables.   Notify your doctor if there is any change in your medical condition (cold, fever, infection).  Wear comfortable clothing (specific to your surgery type) to the hospital.  After surgery, you can help prevent lung complications by doing breathing exercises.  Take deep breaths and cough every 1-2 hours. Your doctor may order a device called an Incentive Spirometer to help you take deep breaths. When coughing or sneezing, hold a pillow firmly against your incision with both hands. This is called "splinting." Doing  this helps protect your incision. It also decreases belly discomfort.  If you are being admitted to the hospital overnight, leave your suitcase in the car. After surgery it may be brought to your room.  If you are being discharged the day of surgery, you will not be allowed to drive home. You will need a responsible adult (18 years or older) to drive you home and stay with you that night.   If you are taking public transportation, you will need to have a  responsible adult (18 years or older) with you. Please confirm with your physician that it is acceptable to use public transportation.   Please call the [facility] at [phone] if you have any questions about these instructions.  Surgery Visitation Policy:  Patients undergoing a surgery or procedure may have two family members or support persons with them as long as the person is not COVID-19 positive or experiencing its symptoms.   Inpatient Visitation:    Visiting hours are 7 a.m. to 8 p.m. Up to four visitors are allowed at one time in a patient room, including children. The visitors may rotate out with other people during the day. One designated support person (adult) may remain overnight.

## 2022-01-09 MED ORDER — CEFAZOLIN IN SODIUM CHLORIDE 3-0.9 GM/100ML-% IV SOLN
3.0000 g | INTRAVENOUS | Status: AC
  Administered 2022-01-10: 3 g via INTRAVENOUS
  Filled 2022-01-09: qty 100

## 2022-01-09 MED ORDER — CHLORHEXIDINE GLUCONATE CLOTH 2 % EX PADS: 6.0000 | MEDICATED_PAD | Freq: Once | CUTANEOUS | Status: DC

## 2022-01-09 MED ORDER — LACTATED RINGERS IV SOLN: INTRAVENOUS | Status: DC

## 2022-01-09 MED ORDER — CHLORHEXIDINE GLUCONATE 0.12 % MT SOLN
15.0000 mL | Freq: Once | OROMUCOSAL | Status: AC
  Administered 2022-01-10: 15 mL via OROMUCOSAL

## 2022-01-09 MED ORDER — ORAL CARE MOUTH RINSE: 15.0000 mL | Freq: Once | OROMUCOSAL | Status: AC

## 2022-01-10 ENCOUNTER — Ambulatory Visit: Payer: BC Managed Care – PPO | Admitting: Certified Registered Nurse Anesthetist

## 2022-01-10 ENCOUNTER — Encounter: Payer: Self-pay | Admitting: General Surgery

## 2022-01-10 ENCOUNTER — Encounter
Admission: RE | Disposition: A | Discharge status: Home / Self Care | Payer: Self-pay | Source: Home / Self Care | Attending: General Surgery

## 2022-01-10 ENCOUNTER — Other Ambulatory Visit: Payer: Self-pay

## 2022-01-10 ENCOUNTER — Ambulatory Visit
Admission: RE | Admit: 2022-01-10 | Discharge: 2022-01-10 | Disposition: A | Discharge status: Home / Self Care | Payer: BC Managed Care – PPO | Attending: General Surgery | Admitting: General Surgery

## 2022-01-10 DIAGNOSIS — Z6841 Body Mass Index (BMI) 40.0 and over, adult: Secondary | ICD-10-CM | POA: Insufficient documentation

## 2022-01-10 DIAGNOSIS — I96 Gangrene, not elsewhere classified: Secondary | ICD-10-CM | POA: Diagnosis not present

## 2022-01-10 DIAGNOSIS — K439 Ventral hernia without obstruction or gangrene: Secondary | ICD-10-CM | POA: Diagnosis present

## 2022-01-10 DIAGNOSIS — K429 Umbilical hernia without obstruction or gangrene: Secondary | ICD-10-CM | POA: Diagnosis not present

## 2022-01-10 DIAGNOSIS — K219 Gastro-esophageal reflux disease without esophagitis: Secondary | ICD-10-CM | POA: Diagnosis not present

## 2022-01-10 DIAGNOSIS — Z8616 Personal history of COVID-19: Secondary | ICD-10-CM | POA: Insufficient documentation

## 2022-01-10 DIAGNOSIS — M1990 Unspecified osteoarthritis, unspecified site: Secondary | ICD-10-CM | POA: Diagnosis not present

## 2022-01-10 HISTORY — PX: VENTRAL HERNIA REPAIR: SHX424

## 2022-01-10 HISTORY — PX: INSERTION OF MESH: SHX5868

## 2022-01-10 SURGERY — REPAIR, HERNIA, VENTRAL
Anesthesia: General

## 2022-01-10 MED ORDER — SUCCINYLCHOLINE CHLORIDE 200 MG/10ML IV SOSY
PREFILLED_SYRINGE | INTRAVENOUS | Status: DC | PRN
  Administered 2022-01-10: 140 mg via INTRAVENOUS

## 2022-01-10 MED ORDER — BUPIVACAINE-EPINEPHRINE (PF) 0.5% -1:200000 IJ SOLN
INTRAMUSCULAR | Status: AC
  Filled 2022-01-10: qty 30

## 2022-01-10 MED ORDER — FENTANYL CITRATE (PF) 100 MCG/2ML IJ SOLN
INTRAMUSCULAR | Status: AC
  Filled 2022-01-10: qty 2

## 2022-01-10 MED ORDER — LIDOCAINE HCL (CARDIAC) PF 100 MG/5ML IV SOSY
PREFILLED_SYRINGE | INTRAVENOUS | Status: DC | PRN
  Administered 2022-01-10: 100 mg via INTRAVENOUS

## 2022-01-10 MED ORDER — DEXMEDETOMIDINE HCL IN NACL 80 MCG/20ML IV SOLN
INTRAVENOUS | Status: DC | PRN
  Administered 2022-01-10 (×5): 8 ug via BUCCAL

## 2022-01-10 MED ORDER — OXYCODONE HCL 5 MG PO TABS
5.0000 mg | ORAL_TABLET | Freq: Once | ORAL | Status: AC | PRN
  Administered 2022-01-10: 5 mg via ORAL

## 2022-01-10 MED ORDER — SUGAMMADEX SODIUM 500 MG/5ML IV SOLN
INTRAVENOUS | Status: AC
  Filled 2022-01-10: qty 5

## 2022-01-10 MED ORDER — DEXMEDETOMIDINE HCL IN NACL 80 MCG/20ML IV SOLN
INTRAVENOUS | Status: AC
  Filled 2022-01-10: qty 20

## 2022-01-10 MED ORDER — FENTANYL CITRATE (PF) 100 MCG/2ML IJ SOLN
25.0000 ug | INTRAMUSCULAR | Status: DC | PRN
  Administered 2022-01-10 (×3): 25 ug via INTRAVENOUS

## 2022-01-10 MED ORDER — LIDOCAINE HCL (PF) 2 % IJ SOLN
INTRAMUSCULAR | Status: AC
  Filled 2022-01-10: qty 5

## 2022-01-10 MED ORDER — CHLORHEXIDINE GLUCONATE 0.12 % MT SOLN
OROMUCOSAL | Status: AC
  Filled 2022-01-10: qty 15

## 2022-01-10 MED ORDER — SUGAMMADEX SODIUM 500 MG/5ML IV SOLN
INTRAVENOUS | Status: DC | PRN
  Administered 2022-01-10: 253.2 mg via INTRAVENOUS

## 2022-01-10 MED ORDER — HYDROCODONE-ACETAMINOPHEN 5-325 MG PO TABS: 1.0000 | ORAL_TABLET | ORAL | 0 refills | Status: AC | PRN

## 2022-01-10 MED ORDER — ONDANSETRON HCL 4 MG/2ML IJ SOLN
INTRAMUSCULAR | Status: AC
  Filled 2022-01-10: qty 2

## 2022-01-10 MED ORDER — FENTANYL CITRATE (PF) 100 MCG/2ML IJ SOLN
INTRAMUSCULAR | Status: DC | PRN
  Administered 2022-01-10 (×6): 50 ug via INTRAVENOUS

## 2022-01-10 MED ORDER — GLYCOPYRROLATE 0.2 MG/ML IJ SOLN
INTRAMUSCULAR | Status: AC
  Filled 2022-01-10: qty 1

## 2022-01-10 MED ORDER — PROPOFOL 10 MG/ML IV BOLUS
INTRAVENOUS | Status: AC
  Filled 2022-01-10: qty 40

## 2022-01-10 MED ORDER — KETOROLAC TROMETHAMINE 30 MG/ML IJ SOLN
INTRAMUSCULAR | Status: DC | PRN
  Administered 2022-01-10: 30 mg via INTRAVENOUS

## 2022-01-10 MED ORDER — OXYCODONE HCL 5 MG PO TABS
ORAL_TABLET | ORAL | Status: AC
  Filled 2022-01-10: qty 1

## 2022-01-10 MED ORDER — MIDAZOLAM HCL 2 MG/2ML IJ SOLN
INTRAMUSCULAR | Status: AC
  Filled 2022-01-10: qty 2

## 2022-01-10 MED ORDER — ROCURONIUM BROMIDE 100 MG/10ML IV SOLN
INTRAVENOUS | Status: DC | PRN
  Administered 2022-01-10: 40 mg via INTRAVENOUS
  Administered 2022-01-10: 20 mg via INTRAVENOUS
  Administered 2022-01-10: 10 mg via INTRAVENOUS

## 2022-01-10 MED ORDER — ROCURONIUM BROMIDE 10 MG/ML (PF) SYRINGE
PREFILLED_SYRINGE | INTRAVENOUS | Status: AC
  Filled 2022-01-10: qty 10

## 2022-01-10 MED ORDER — DEXAMETHASONE SODIUM PHOSPHATE 10 MG/ML IJ SOLN
INTRAMUSCULAR | Status: AC
  Filled 2022-01-10: qty 1

## 2022-01-10 MED ORDER — ACETAMINOPHEN 10 MG/ML IV SOLN
INTRAVENOUS | Status: AC
  Filled 2022-01-10: qty 100

## 2022-01-10 MED ORDER — PROPOFOL 10 MG/ML IV BOLUS
INTRAVENOUS | Status: DC | PRN
  Administered 2022-01-10: 200 mg via INTRAVENOUS

## 2022-01-10 MED ORDER — ACETAMINOPHEN 10 MG/ML IV SOLN
INTRAVENOUS | Status: DC | PRN
  Administered 2022-01-10: 1000 mg via INTRAVENOUS

## 2022-01-10 MED ORDER — DEXAMETHASONE SODIUM PHOSPHATE 10 MG/ML IJ SOLN
INTRAMUSCULAR | Status: DC | PRN
  Administered 2022-01-10: 10 mg via INTRAVENOUS

## 2022-01-10 MED ORDER — MIDAZOLAM HCL 2 MG/2ML IJ SOLN
INTRAMUSCULAR | Status: DC | PRN
  Administered 2022-01-10: 2 mg via INTRAVENOUS

## 2022-01-10 MED ORDER — OXYCODONE HCL 5 MG/5ML PO SOLN: 5.0000 mg | Freq: Once | ORAL | Status: AC | PRN

## 2022-01-10 MED ORDER — BUPIVACAINE-EPINEPHRINE (PF) 0.5% -1:200000 IJ SOLN
INTRAMUSCULAR | Status: DC | PRN
  Administered 2022-01-10: 30 mL

## 2022-01-10 MED ORDER — ONDANSETRON HCL 4 MG/2ML IJ SOLN
INTRAMUSCULAR | Status: DC | PRN
  Administered 2022-01-10: 4 mg via INTRAVENOUS

## 2022-01-10 SURGICAL SUPPLY — 46 items
APL PRP STRL LF DISP 70% ISPRP (MISCELLANEOUS) ×2
APL SKNCLS STERI-STRIP NONHPOA (GAUZE/BANDAGES/DRESSINGS) ×2
BENZOIN TINCTURE PRP APPL 2/3 (GAUZE/BANDAGES/DRESSINGS) ×2 IMPLANT
BLADE BOVIE TIP EXT 4 (BLADE) ×1 IMPLANT
BLADE SURG 15 STRL SS SAFETY (BLADE) ×2 IMPLANT
BULB RESERV EVAC DRAIN JP 100C (MISCELLANEOUS) ×1 IMPLANT
CHLORAPREP W/TINT 26 (MISCELLANEOUS) ×2 IMPLANT
DRAIN CHANNEL JP 15F RND 16 (MISCELLANEOUS) IMPLANT
DRAIN JP 15F RND TROCAR (DRAIN) ×1 IMPLANT
DRAPE LAPAROTOMY TRNSV 106X77 (MISCELLANEOUS) ×2 IMPLANT
DRSG OPSITE POSTOP 4X6 (GAUZE/BANDAGES/DRESSINGS) ×1 IMPLANT
DRSG OPSITE POSTOP 4X8 (GAUZE/BANDAGES/DRESSINGS) ×1 IMPLANT
DRSG TEGADERM 4X4.75 (GAUZE/BANDAGES/DRESSINGS) ×1 IMPLANT
ELECT REM PT RETURN 9FT ADLT (ELECTROSURGICAL) ×2
ELECTRODE REM PT RTRN 9FT ADLT (ELECTROSURGICAL) ×2 IMPLANT
GLOVE BIO SURGEON STRL SZ7.5 (GLOVE) ×2 IMPLANT
GLOVE SURG UNDER LTX SZ8 (GLOVE) ×7 IMPLANT
GOWN STRL REUS W/ TWL LRG LVL3 (GOWN DISPOSABLE) ×5 IMPLANT
GOWN STRL REUS W/TWL LRG LVL3 (GOWN DISPOSABLE) ×6
KIT TURNOVER KIT A (KITS) ×2 IMPLANT
LABEL OR SOLS (LABEL) ×2 IMPLANT
MANIFOLD NEPTUNE II (INSTRUMENTS) ×2 IMPLANT
MESH VENTRALEX ST 8CM LRG (Mesh General) ×1 IMPLANT
NDL HYPO 25X1 1.5 SAFETY (NEEDLE) ×1 IMPLANT
NEEDLE HYPO 22GX1.5 SAFETY (NEEDLE) ×2 IMPLANT
NEEDLE HYPO 25X1 1.5 SAFETY (NEEDLE) ×2 IMPLANT
NS IRRIG 500ML POUR BTL (IV SOLUTION) ×2 IMPLANT
PACK BASIN MINOR ARMC (MISCELLANEOUS) ×2 IMPLANT
SPONGE T-LAP 18X18 ~~LOC~~+RFID (SPONGE) ×2 IMPLANT
STAPLER SKIN PROX 35W (STAPLE) IMPLANT
STRIP CLOSURE SKIN 1/2X4 (GAUZE/BANDAGES/DRESSINGS) ×2 IMPLANT
SUT MAXON ABS #0 GS21 30IN (SUTURE) ×3 IMPLANT
SUT SURGILON 0 BLK (SUTURE) ×2 IMPLANT
SUT VIC AB 0 CT1 36 (SUTURE) ×2 IMPLANT
SUT VIC AB 2-0 BRD 54 (SUTURE) ×2 IMPLANT
SUT VIC AB 2-0 CT1 (SUTURE) IMPLANT
SUT VIC AB 3-0 54X BRD REEL (SUTURE) ×2 IMPLANT
SUT VIC AB 3-0 BRD 54 (SUTURE) ×2
SUT VIC AB 3-0 SH 27 (SUTURE) ×2
SUT VIC AB 3-0 SH 27X BRD (SUTURE) ×2 IMPLANT
SUT VIC AB 4-0 FS2 27 (SUTURE) ×2 IMPLANT
SYR 10ML LL (SYRINGE) ×2 IMPLANT
SYR 3ML LL SCALE MARK (SYRINGE) ×2 IMPLANT
TRAP FLUID SMOKE EVACUATOR (MISCELLANEOUS) ×2 IMPLANT
TRAY FOLEY MTR SLVR 16FR STAT (SET/KITS/TRAYS/PACK) IMPLANT
WATER STERILE IRR 500ML POUR (IV SOLUTION) ×2 IMPLANT

## 2022-01-10 NOTE — Transfer of Care (Signed)
Immediate Anesthesia Transfer of Care Note  Patient: Issiac Jamar  Procedure(s) Performed: HERNIA REPAIR VENTRAL ADULT INSERTION OF MESH  Patient Location: PACU  Anesthesia Type:General  Level of Consciousness: awake, alert  and oriented  Airway & Oxygen Therapy: Patient Spontanous Breathing and Patient connected to face mask oxygen  Post-op Assessment: Report given to RN and Post -op Vital signs reviewed and stable  Post vital signs: Reviewed and stable  Last Vitals:  Vitals Value Taken Time  BP 134/84 01/10/22 0910  Temp    Pulse 59 01/10/22 0912  Resp 14 01/10/22 0912  SpO2 97 % 01/10/22 0912  Vitals shown include unvalidated device data.  Last Pain:  Vitals:   01/10/22 0618  TempSrc: Temporal  PainSc: 0-No pain         Complications: No notable events documented.

## 2022-01-10 NOTE — Anesthesia Preprocedure Evaluation (Signed)
Anesthesia Evaluation  Patient identified by MRN, date of birth, ID band Patient awake    Reviewed: Allergy & Precautions, NPO status , Patient's Chart, lab work & pertinent test results  History of Anesthesia Complications Negative for: history of anesthetic complications  Airway Mallampati: III  TM Distance: >3 FB Neck ROM: full    Dental  (+) Dental Advidsory Given, Teeth Intact   Pulmonary neg pulmonary ROS, neg shortness of breath, neg COPD, former smoker,    Pulmonary exam normal        Cardiovascular (-) angina(-) Past MI and (-) CABG negative cardio ROS Normal cardiovascular exam     Neuro/Psych negative neurological ROS  negative psych ROS   GI/Hepatic negative GI ROS, Neg liver ROS,   Endo/Other  negative endocrine ROS  Renal/GU      Musculoskeletal   Abdominal   Peds  Hematology negative hematology ROS (+)   Anesthesia Other Findings Past Medical History: No date: Arthritis No date: GERD (gastroesophageal reflux disease) 08/06/2019: Lab test positive for detection of COVID-19 virus No date: Morbid obesity ([place]) No date: Osteoarthritis No date: Pneumonia  Past Surgical History: 2000: APPENDECTOMY No date: COLONOSCOPY W/ POLYPECTOMY 10/21/2019: COLONOSCOPY WITH PROPOFOL; N/A     Comment:  Procedure: COLONOSCOPY WITH PROPOFOL;  Surgeon: Toledo,               Benay Pike, MD;  Location: [facility];  Service:               Gastroenterology;  Laterality: N/A;  Patient tested               positive for COVID-19 on 08-06-19 (copy of results on               chart and is in EPIC) 02/20/2019: KNEE ARTHROPLASTY; Left     Comment:  Procedure: COMPUTER ASSISTED TOTAL KNEE ARTHROPLASTY;                Surgeon: Dereck Leep, MD;  Location: [facility] ORS;                Service: Orthopedics;  Laterality: Left; 06/04/2021: KNEE ARTHROPLASTY; Right     Comment:  Procedure: COMPUTER ASSISTED TOTAL KNEE  ARTHROPLASTY;                Surgeon: Dereck Leep, MD;  Location: [facility] ORS;                Service: Orthopedics;  Laterality: Right; No date: TONSILLECTOMY     Comment:  age 56  and adnoids  BMI    Body Mass Index: 43.70 kg/m      Reproductive/Obstetrics negative OB ROS                             Anesthesia Physical Anesthesia Plan  ASA: 3  Anesthesia Plan: General ETT   Post-op Pain Management:    Induction: Intravenous  PONV Risk Score and Plan: 3 and Ondansetron, Dexamethasone, Midazolam and Treatment may vary due to age or medical condition  Airway Management Planned: Oral ETT  Additional Equipment:   Intra-op Plan:   Post-operative Plan: Extubation in OR  Informed Consent: I have reviewed the patients History and Physical, chart, labs and discussed the procedure including the risks, benefits and alternatives for the proposed anesthesia with the patient or authorized representative who has indicated his/her understanding and acceptance.     Dental Advisory Given  Plan Discussed with: Anesthesiologist, CRNA and Surgeon  Anesthesia Plan Comments: (Patient consented for risks of anesthesia including but not limited to:  - adverse reactions to medications - damage to eyes, teeth, lips or other oral mucosa - nerve damage due to positioning  - sore throat or hoarseness - Damage to heart, brain, nerves, lungs, other parts of body or loss of life  Patient voiced understanding.)        Anesthesia Quick Evaluation

## 2022-01-10 NOTE — Anesthesia Procedure Notes (Signed)
Procedure Name: Intubation Date/Time: 01/10/2022 7:35 AM  Performed by: Demetrius Charity, CRNAPre-anesthesia Checklist: Patient identified, Patient being monitored, Timeout performed, Emergency Drugs available and Suction available Patient Re-evaluated:Patient Re-evaluated prior to induction Oxygen Delivery Method: Circle system utilized Preoxygenation: Pre-oxygenation with 100% oxygen Induction Type: IV induction Ventilation: Mask ventilation without difficulty Laryngoscope Size: McGraph and 4 Grade View: Grade I Tube type: Oral Tube size: 7.5 mm Number of attempts: 1 Airway Equipment and Method: Stylet and Video-laryngoscopy Placement Confirmation: ETT inserted through vocal cords under direct vision, positive ETCO2 and breath sounds checked- equal and bilateral Secured at: 23 cm Tube secured with: Tape Dental Injury: Teeth and Oropharynx as per pre-operative assessment

## 2022-01-10 NOTE — H&P (Signed)
Derek Joseph [id number] 1965/11/03     HPI: Patient with ventral hernia for mesh repair.   Medications Prior to Admission  Medication Sig Dispense Refill Last Dose   acetaminophen (TYLENOL) 500 MG tablet Take 1,000 mg by mouth every 8 (eight) hours as needed for mild pain.   Past Month   celecoxib (CELEBREX) 200 MG capsule Take 1 capsule (200 mg total) by mouth 2 (two) times daily. 90 capsule 0 01/10/2022 at 0400   lovastatin (MEVACOR) 20 MG tablet Take 20 mg by mouth daily after supper.   Past Week   omeprazole (PRILOSEC) 20 MG capsule Take 20 mg by mouth 2 (two) times daily before a meal.   01/10/2022 at 0400   acidophilus (RISAQUAD) CAPS capsule Take 1 capsule by mouth daily. (Patient not taking: Reported on 01/10/2022)   Not Taking   Allergies  Allergen Reactions   Codeine     Constipation: immediate and severe   Past Medical History:  Diagnosis Date   Arthritis    GERD (gastroesophageal reflux disease)    Lab test positive for detection of COVID-19 virus 08/06/2019   Morbid obesity ([place])    Osteoarthritis    Pneumonia    Past Surgical History:  Procedure Laterality Date   APPENDECTOMY  2000   COLONOSCOPY W/ POLYPECTOMY     COLONOSCOPY WITH PROPOFOL N/A 10/21/2019   Procedure: COLONOSCOPY WITH PROPOFOL;  Surgeon: Toledo, Benay Pike, MD;  Location: [facility];  Service: Gastroenterology;  Laterality: N/A;  Patient tested positive for COVID-19 on 08-06-19 (copy of results on chart and is in EPIC)   KNEE ARTHROPLASTY Left 02/20/2019   Procedure: COMPUTER ASSISTED TOTAL KNEE ARTHROPLASTY;  Surgeon: Dereck Leep, MD;  Location: [facility] OR[facility];  Service: Orthopedics;  Laterality: Left;   KNEE ARTHROPLASTY Right 06/04/2021   Procedure: COMPUTER ASSISTED TOTAL KNEE ARTHROPLASTY;  Surgeon: Dereck Leep, MD;  Location: [facility] OR[facility];  Service: Orthopedics;  Laterality: Right;   TONSILLECTOMY     age 56  and adnoids   Social History   Socioeconomic History   Marital status: Married     Spouse name: Derek Joseph   Number of children: Not on file   Years of education: Not on file   Highest education level: Not on file  Occupational History   Not on file  Tobacco Use   Smoking status: Former    Packs/day: 1.00    Years: 35.00    Total pack years: 35.00    Types: Cigarettes    Quit date: 03/12/2012    Years since quitting: 9.8   Smokeless tobacco: Never  Vaping Use   Vaping Use: Never used  Substance and Sexual Activity   Alcohol use: Yes    Comment: rare   Drug use: Not Currently    Types: Marijuana   Sexual activity: Not on file  Other Topics Concern   Not on file  Social History Narrative   Not on file   Social Determinants of Health   Financial Resource Strain: Not on file  Food Insecurity: Not on file  Transportation Needs: Not on file  Physical Activity: Not on file  Stress: Not on file  Social Connections: Not on file  Intimate Partner Violence: Not on file   Social History   Social History Narrative   Not on file     ROS: Negative.     PE: HEENT: Negative. Lungs: Clear. Cardio: RR.  Assessment/Plan:  Proceed with planned hernia repair.   Derek Joseph 01/10/2022

## 2022-01-10 NOTE — Discharge Instructions (Signed)

## 2022-01-10 NOTE — Op Note (Signed)
Preoperative diagnosis: Ventral hernia, 3 cm diameter with omentum.  Focal skin necrosis of the overlying umbilical skin.  Postoperative diagnosis: Same.  Operative procedure: Excision of umbilical skin, repair of ventral hernia with 8 cm intraperitoneal Ventralight ST mesh.  Operating surgeon: Hervey Ard, MD.  Anesthesia: General endotracheal.  Marcaine 0.5% with 1: 200,000 units of epinephrine, 30 cc.  Drains: Blake drain x1.  Estimated blood loss: 10 cc.  Clinical note: This 56 year old male has an enlarging umbilical hernia.  CT showed evidence of a 3 cm fascial defect with a large amount of omentum.  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  He received Ancef intravenously prior to the procedure, 3 g based on a weight.  SCD stockings for DVT prevention.  Operative note: With the patient under adequate general endotracheal anesthesia hair was removed and the surgical site and the abdomen cleansed with ChloraPrep and draped.  The umbilical skin especially on the right side of the umbilicus was ulcerated and excoriated.  It was elected to resect this.  An elliptical vertically oriented incision was made.  The skin was incised sharply and remaining dissection completed with electrocautery.  The skin was excised and discarded.  The hernia sac was then mobilized circumferentially at the fascial layer.  Sac was opened and only omentum with a few adhesions was appreciated.  This was reduced into the intra-abdominal cavity.  The hernia sac was excised at the fascial level and is discarded.  Palpation through the 3 cm defect showed no palpable masses or unusual thickening.  The undersurface of the fascia was cleared circumferentially with cautery.  An 8 cm Ventralight ST mesh was then placed intraperitoneally and 4 transfascial transfixion sutures were placed with 0 Maxon.  The defect was then  closed transversely with interrupted 0 Maxon sutures including a small "bite" of the mesh with each stitch.  The fat was mobilized to allow closure over a 15 [place] Blake drain which had been brought out through a separate stab wound incision in the lower quadrant on the right side.  The drain was anchored in position with 3-0 nylon suture.  Fat was approximated with 2-0 Vicryl figure-of-eight sutures in 2 layers.  The dermal layer was approximated with interrupted 3-0 Vicryl sutures.  Staples were used for the skin.  Telfa and Tegaderm for the drain site and a honeycomb dressing for the main incision were placed.  The patient very gently awakened from anesthesia without coughing.  He was taken to the PACU in stable condition.Marland Kitchen

## 2022-01-11 ENCOUNTER — Encounter: Payer: Self-pay | Admitting: General Surgery

## 2022-01-11 NOTE — Anesthesia Postprocedure Evaluation (Signed)
Anesthesia Post Note  Patient: Derek Joseph  Procedure(s) Performed: HERNIA REPAIR VENTRAL ADULT INSERTION OF MESH  Patient location during evaluation: PACU Anesthesia Type: General Level of consciousness: awake and alert Pain management: pain level controlled Vital Signs Assessment: post-procedure vital signs reviewed and stable Respiratory status: spontaneous breathing, nonlabored ventilation, respiratory function stable and patient connected to nasal cannula oxygen Cardiovascular status: blood pressure returned to baseline and stable Postop Assessment: no apparent nausea or vomiting Anesthetic complications: no   No notable events documented.   Last Vitals:  Vitals:   01/10/22 1012 01/10/22 1037  BP: 107/86 132/84  Pulse: 60 64  Resp: 16   Temp: (!) 36.4 C   SpO2: 95% 95%    Last Pain:  Vitals:   01/11/22 1252  TempSrc:   PainSc: [place]

## 2022-08-15 IMAGING — DX DG KNEE 1-2V PORT*R*
4 series · 4 of 4 positions shown · non-contrast
Comparison: None.

CLINICAL DATA: Status post total right knee arthroplasty.

EXAM:
PORTABLE RIGHT KNEE - 1-2 VIEW

[knee ap]
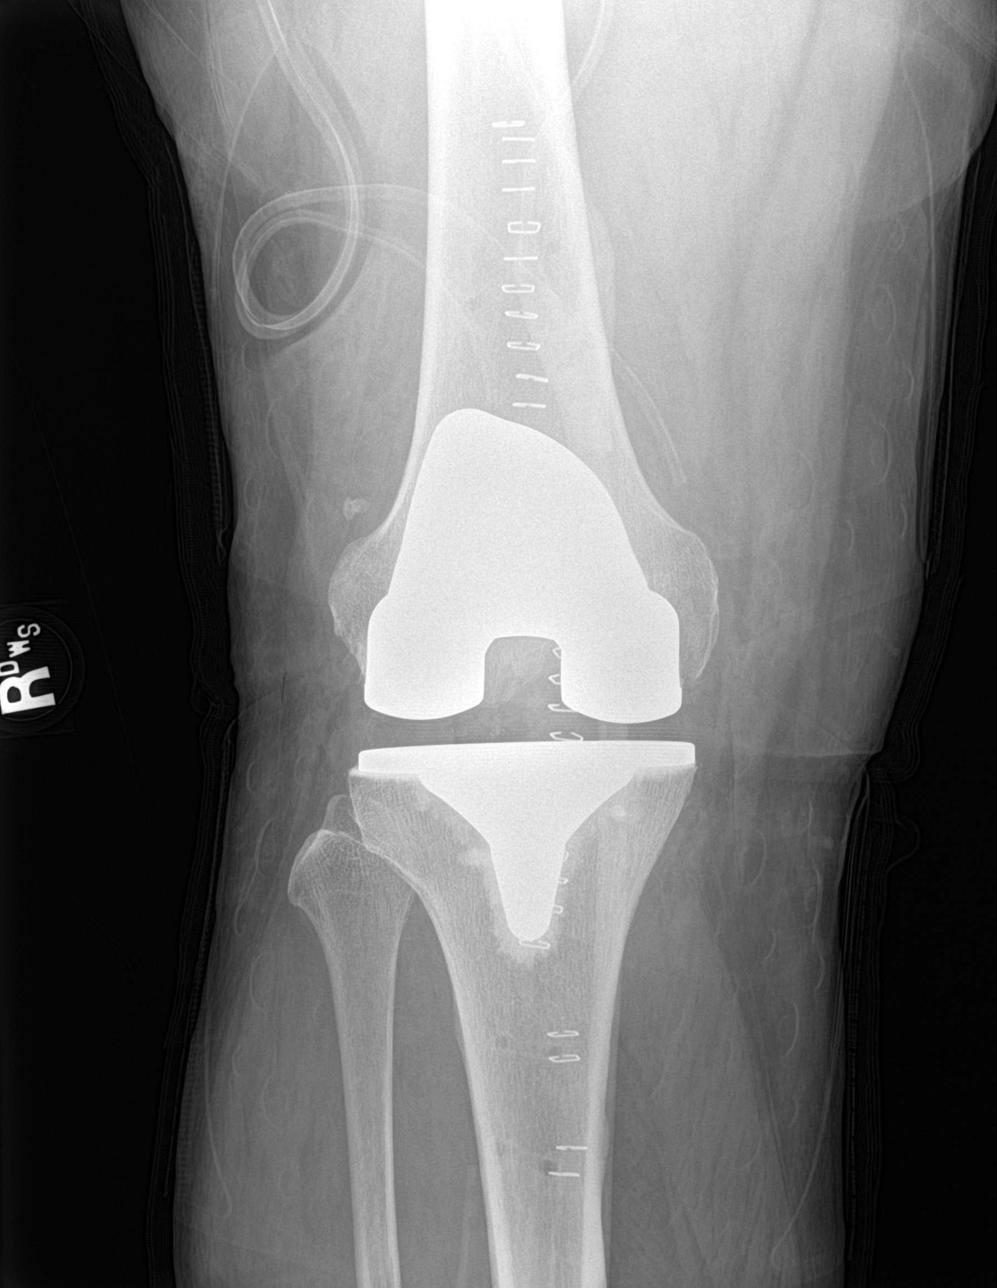

[knee lat]
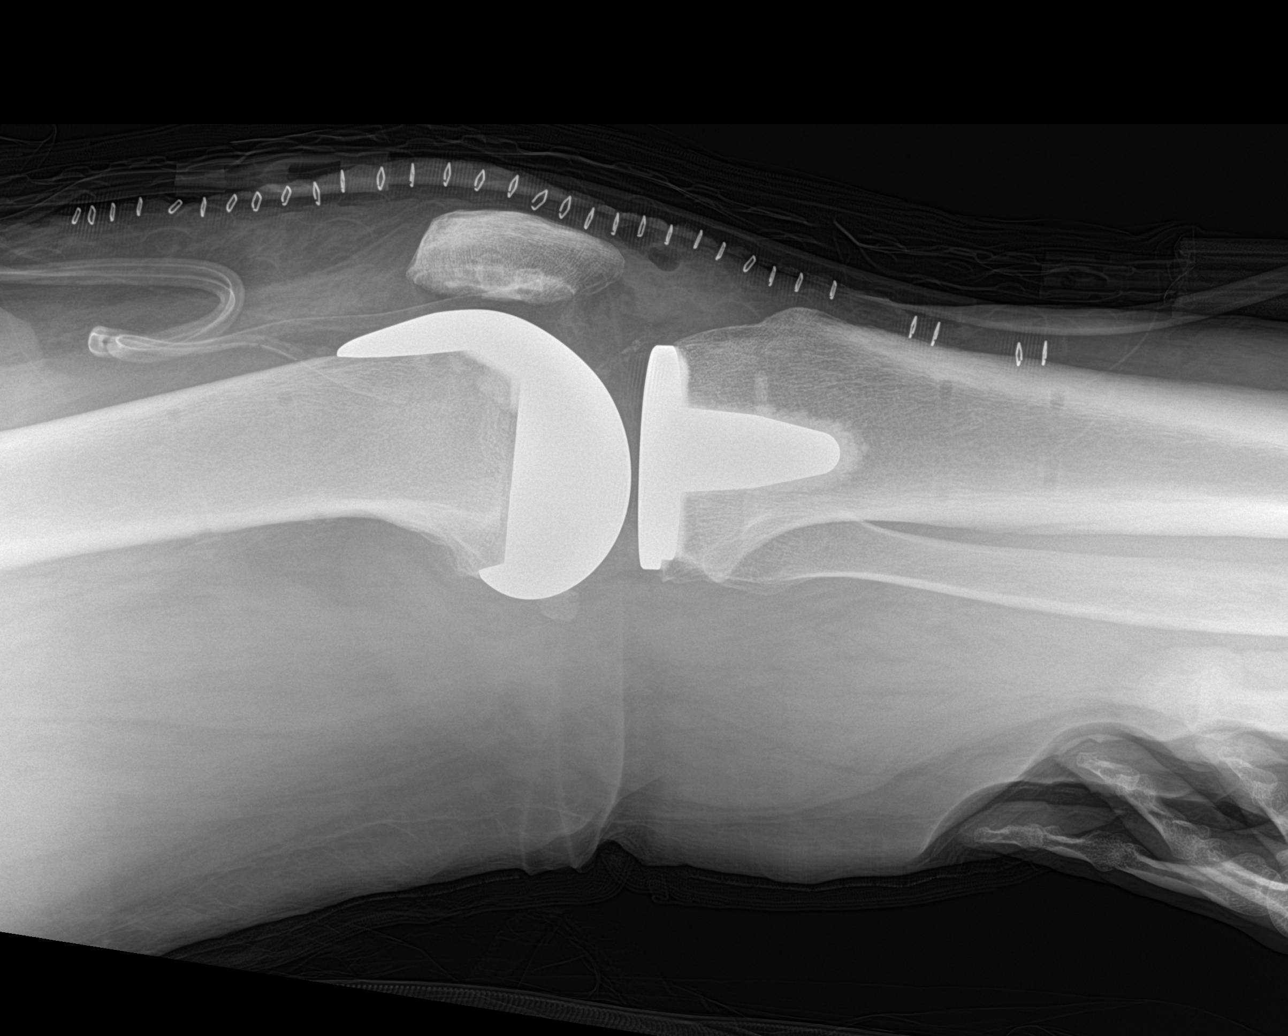

[knee obl (1 of 2)]
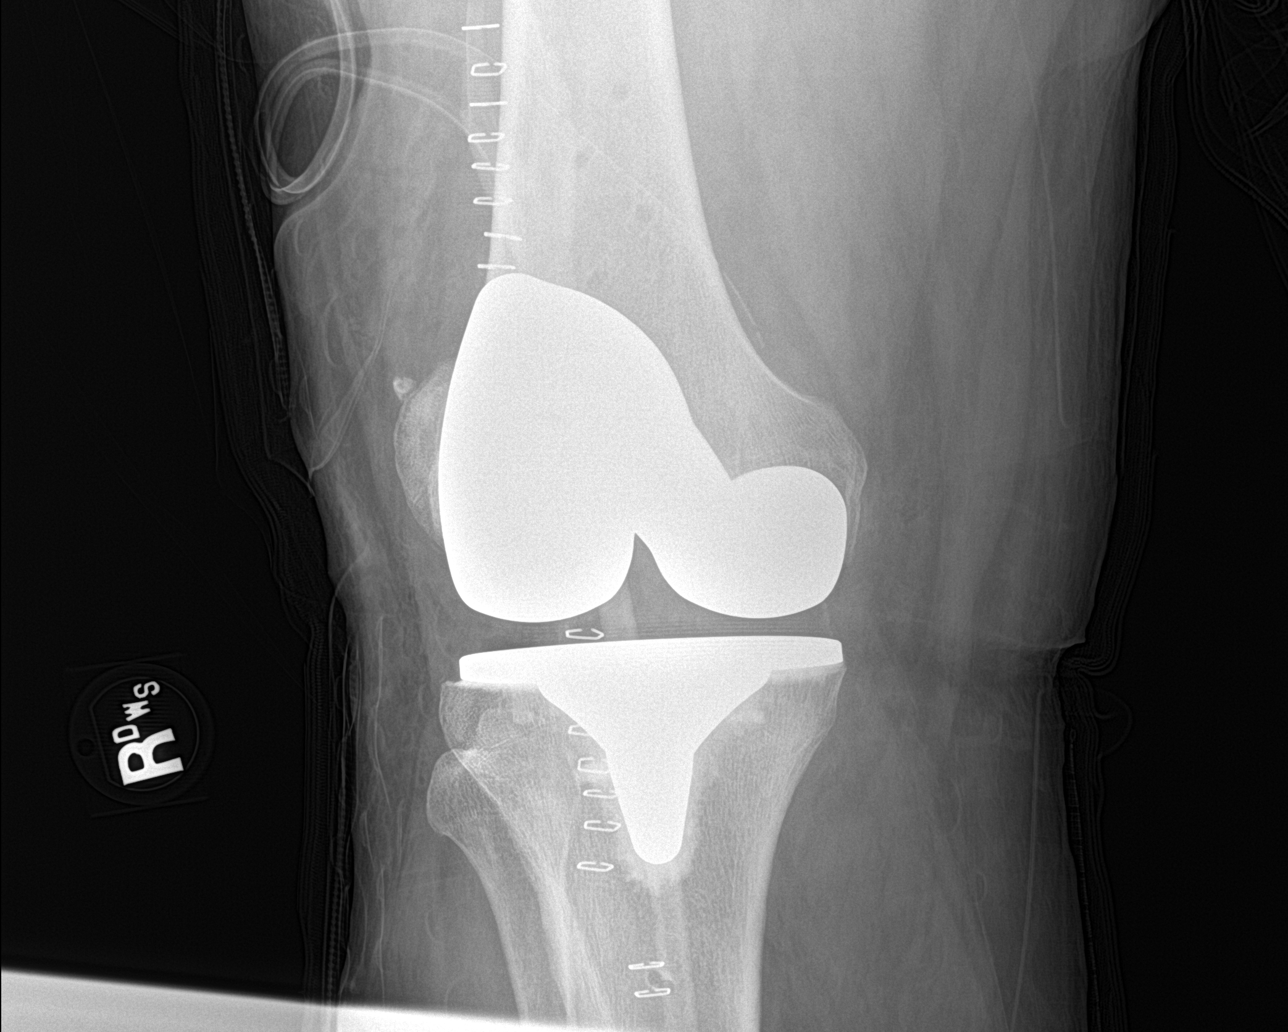

[knee obl (2 of 2)]
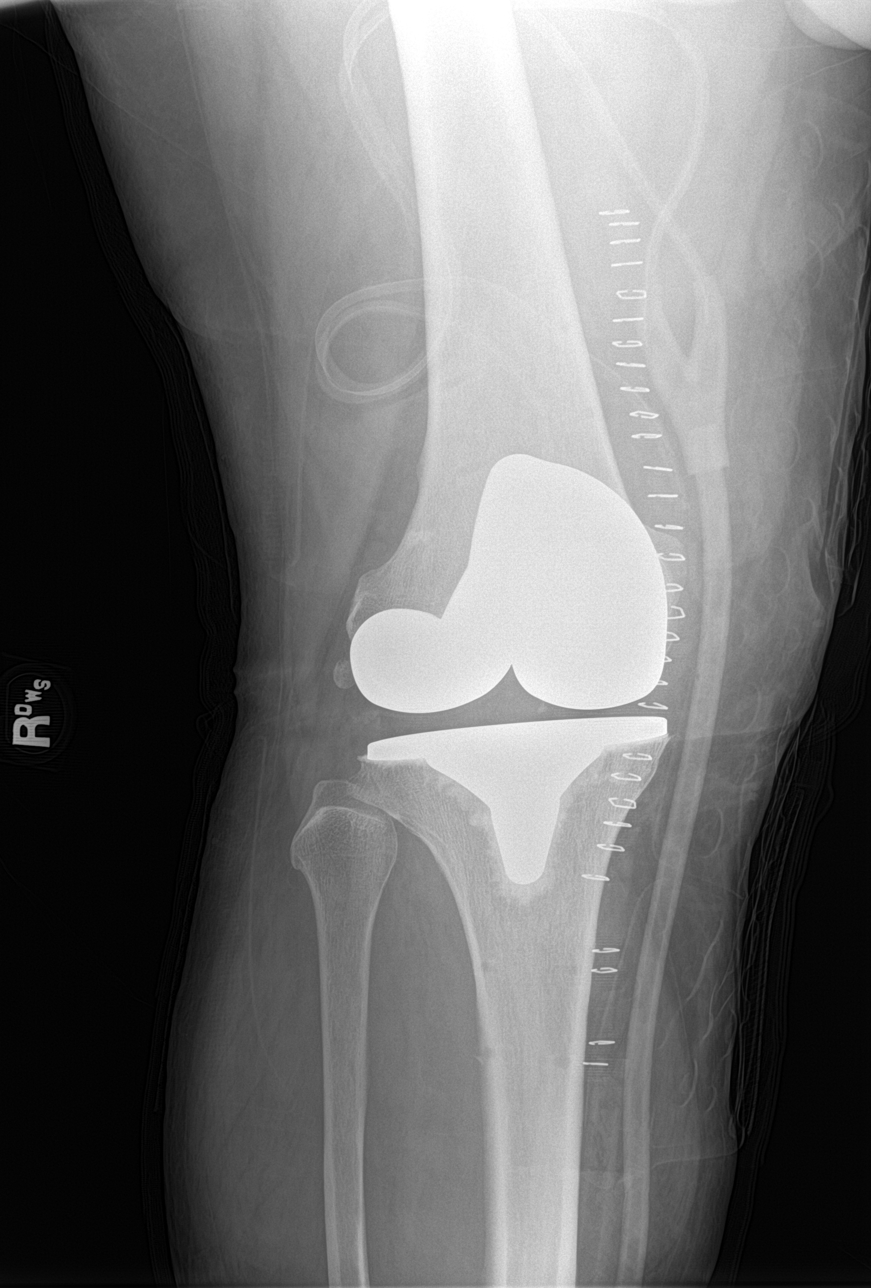

[4 of 4 positions shown; findings below may reference images not displayed]

FINDINGS: Status post total right knee arthroplasty. No perihardware lucency
is seen to indicate hardware failure or loosening. A surgical drain
overlies the suprapatellar joint. Anterior knee surgical skin
staples. Mild subcutaneous air. No acute fracture or dislocation.
Old screw tracts within the proximal tibial metadiaphysis.
IMPRESSION: Status post recent total right knee arthroplasty without evidence of
hardware failure or acute fracture.

## 2022-09-13 ENCOUNTER — Other Ambulatory Visit: Payer: Self-pay | Admitting: *Deleted

## 2022-09-13 DIAGNOSIS — Z122 Encounter for screening for malignant neoplasm of respiratory organs: Secondary | ICD-10-CM

## 2022-09-13 DIAGNOSIS — Z87891 Personal history of nicotine dependence: Secondary | ICD-10-CM

## 2022-11-01 ENCOUNTER — Encounter: Payer: Self-pay | Admitting: Acute Care

## 2022-11-01 ENCOUNTER — Ambulatory Visit
Admission: RE | Admit: 2022-11-01 | Discharge: 2022-11-01 | Disposition: A | Discharge status: Home / Self Care | Payer: BC Managed Care – PPO | Source: Ambulatory Visit | Attending: Acute Care | Admitting: Acute Care

## 2022-11-01 ENCOUNTER — Ambulatory Visit: Payer: BC Managed Care – PPO | Admitting: Acute Care

## 2022-11-01 DIAGNOSIS — Z122 Encounter for screening for malignant neoplasm of respiratory organs: Secondary | ICD-10-CM | POA: Insufficient documentation

## 2022-11-01 DIAGNOSIS — Z87891 Personal history of nicotine dependence: Secondary | ICD-10-CM | POA: Insufficient documentation

## 2022-11-01 NOTE — Progress Notes (Signed)
Virtual Visit via Telephone Note  I connected with Allena Napoleon on 11/01/22 at  3:30 PM EDT by telephone and verified that I am speaking with the correct person using two identifiers.  Location: Patient:  At home  Provider: [address], [place], [place], [address]    I discussed the limitations, risks, security and privacy concerns of performing an evaluation and management service by telephone and the availability of in person appointments. I also discussed with the patient that there may be a patient responsible charge related to this service. The patient expressed understanding and agreed to proceed.    Shared Decision Making Visit Lung Cancer Screening Program [phone])   Eligibility: Age 57 y.o. Pack Years Smoking History Calculation 48 pack year smoking history (# packs/per year x # years smoked) Recent History of coughing up blood  no Unexplained weight loss? no ( >Than 15 pounds within the last 6 months ) Prior History Lung / other cancer no (Diagnosis within the last 5 years already requiring surveillance chest CT Scans). Smoking Status Former Smoker Former Smokers: Years since quit: 9 years, 9 months ago  Quit Date: 03/12/2012  Visit Components: Discussion included one or more decision making aids. yes Discussion included risk/benefits of screening. yes Discussion included potential follow up diagnostic testing for abnormal scans. yes Discussion included meaning and risk of over diagnosis. yes Discussion included meaning and risk of False Positives. yes Discussion included meaning of total radiation exposure. yes  Counseling Included: Importance of adherence to annual lung cancer LDCT screening. yes Impact of comorbidities on ability to participate in the program. yes Ability and willingness to under diagnostic treatment. yes  Smoking Cessation Counseling: Current Smokers:  Discussed importance of smoking cessation. yes Information about tobacco  cessation classes and interventions provided to patient. yes Patient provided with "ticket" for LDCT Scan. yes Symptomatic Patient. no  Counseling NA Diagnosis Code: Tobacco Use Z72.0 Asymptomatic Patient yes  Counseling NA Former Smokers:  Discussed the importance of maintaining cigarette abstinence. yes Diagnosis Code: Personal History of Nicotine Dependence. U98.119 Information about tobacco cessation classes and interventions provided to patient. Yes Patient provided with "ticket" for LDCT Scan. yes Written Order for Lung Cancer Screening with LDCT placed in Epic. Yes (CT Chest Lung Cancer Screening Low Dose W/O CM) [id number] Z12.2-Screening of respiratory organs Z87.891-Personal history of nicotine dependence  I spent 25 minutes of face to face time/virtual visit time  with  Mr. Paradis discussing the risks and benefits of lung cancer screening. We took the time to pause the power point at intervals to allow for questions to be asked and answered to ensure understanding. We discussed that he had taken the single most powerful action possible to decrease his risk of developing lung cancer when he quit smoking. I counseled him to remain smoke free, and to contact me if he ever had the desire to smoke again so that I can provide resources and tools to help support the effort to remain smoke free. We discussed the time and location of the scan, and that either  Abigail Miyamoto RN, Karlton Lemon, RN or I  or I will call / send a letter with the results within  24-72 hours of receiving them. He has the office contact information in the event he needs to speak with me,  he verbalized understanding of all of the above and had no further questions upon leaving the office.     I explained to the patient that there has been  a high incidence of coronary artery disease noted on these exams. I explained that this is a non-gated exam therefore degree or severity cannot be determined. This patient is on statin  therapy. I have asked the patient to follow-up with their PCP regarding any incidental finding of coronary artery disease and management with diet or medication as they feel is clinically indicated. The patient verbalized understanding of the above and had no further questions.     Bevelyn Ngo, NP 11/01/2022

## 2022-11-01 NOTE — Patient Instructions (Signed)

## 2022-11-02 ENCOUNTER — Ambulatory Visit: Payer: BC Managed Care – PPO

## 2022-11-09 ENCOUNTER — Other Ambulatory Visit: Payer: Self-pay

## 2022-11-09 DIAGNOSIS — Z87891 Personal history of nicotine dependence: Secondary | ICD-10-CM

## 2022-11-09 DIAGNOSIS — Z122 Encounter for screening for malignant neoplasm of respiratory organs: Secondary | ICD-10-CM

## 2023-09-01 ENCOUNTER — Other Ambulatory Visit: Payer: Self-pay | Admitting: Family Medicine

## 2023-09-01 ENCOUNTER — Other Ambulatory Visit: Payer: Self-pay | Admitting: Gerontology

## 2023-09-01 DIAGNOSIS — I7121 Aneurysm of the ascending aorta, without rupture: Secondary | ICD-10-CM

## 2023-09-01 DIAGNOSIS — Z8051 Family history of malignant neoplasm of kidney: Secondary | ICD-10-CM

## 2023-09-06 ENCOUNTER — Encounter: Payer: Self-pay | Admitting: Gerontology

## 2024-02-13 ENCOUNTER — Encounter: Payer: Self-pay | Admitting: Internal Medicine

## 2024-02-20 ENCOUNTER — Ambulatory Visit
Admission: RE | Admit: 2024-02-20 | Discharge: 2024-02-20 | Disposition: A | Discharge status: Home / Self Care | Attending: Internal Medicine | Admitting: Internal Medicine

## 2024-02-20 ENCOUNTER — Ambulatory Visit: Payer: Self-pay

## 2024-02-20 ENCOUNTER — Other Ambulatory Visit: Payer: Self-pay

## 2024-02-20 ENCOUNTER — Encounter
Admission: RE | Disposition: A | Discharge status: Home / Self Care | Payer: Self-pay | Source: Home / Self Care | Attending: Internal Medicine

## 2024-02-20 ENCOUNTER — Encounter: Payer: Self-pay | Admitting: Internal Medicine

## 2024-02-20 DIAGNOSIS — Z6841 Body Mass Index (BMI) 40.0 and over, adult: Secondary | ICD-10-CM | POA: Insufficient documentation

## 2024-02-20 DIAGNOSIS — Z83719 Family history of colon polyps, unspecified: Secondary | ICD-10-CM | POA: Diagnosis not present

## 2024-02-20 DIAGNOSIS — D122 Benign neoplasm of ascending colon: Secondary | ICD-10-CM | POA: Insufficient documentation

## 2024-02-20 DIAGNOSIS — Z87891 Personal history of nicotine dependence: Secondary | ICD-10-CM | POA: Insufficient documentation

## 2024-02-20 DIAGNOSIS — D123 Benign neoplasm of transverse colon: Secondary | ICD-10-CM | POA: Insufficient documentation

## 2024-02-20 DIAGNOSIS — E66813 Obesity, class 3: Secondary | ICD-10-CM | POA: Insufficient documentation

## 2024-02-20 DIAGNOSIS — J432 Centrilobular emphysema: Secondary | ICD-10-CM | POA: Diagnosis not present

## 2024-02-20 DIAGNOSIS — K5730 Diverticulosis of large intestine without perforation or abscess without bleeding: Secondary | ICD-10-CM | POA: Insufficient documentation

## 2024-02-20 DIAGNOSIS — Z09 Encounter for follow-up examination after completed treatment for conditions other than malignant neoplasm: Secondary | ICD-10-CM | POA: Diagnosis present

## 2024-02-20 DIAGNOSIS — K640 First degree hemorrhoids: Secondary | ICD-10-CM | POA: Insufficient documentation

## 2024-02-20 DIAGNOSIS — K219 Gastro-esophageal reflux disease without esophagitis: Secondary | ICD-10-CM | POA: Insufficient documentation

## 2024-02-20 HISTORY — DX: Vitamin D deficiency, unspecified: E55.9

## 2024-02-20 HISTORY — DX: Deficiency of other specified B group vitamins: E53.8

## 2024-02-20 HISTORY — DX: Polyp of colon: K63.5

## 2024-02-20 HISTORY — DX: Atherosclerosis of aorta: I70.0

## 2024-02-20 HISTORY — DX: Diverticulosis of intestine, part unspecified, without perforation or abscess without bleeding: K57.90

## 2024-02-20 HISTORY — PX: COLONOSCOPY: SHX5424

## 2024-02-20 HISTORY — DX: Centrilobular emphysema: J43.2

## 2024-02-20 HISTORY — DX: Aneurysm of the ascending aorta, without rupture: I71.21

## 2024-02-20 HISTORY — DX: Sclerosing mesenteritis: K65.4

## 2024-02-20 SURGERY — COLONOSCOPY
Anesthesia: General

## 2024-02-20 MED ORDER — ONDANSETRON HCL 4 MG/2ML IJ SOLN
INTRAMUSCULAR | Status: AC
  Filled 2024-02-20: qty 2

## 2024-02-20 MED ORDER — PROPOFOL 10 MG/ML IV BOLUS
INTRAVENOUS | Status: DC | PRN
  Administered 2024-02-20: 50 mg via INTRAVENOUS
  Administered 2024-02-20: 30 mg via INTRAVENOUS

## 2024-02-20 MED ORDER — SODIUM CHLORIDE 0.9 % IV SOLN: INTRAVENOUS | Status: DC

## 2024-02-20 MED ORDER — PROPOFOL 500 MG/50ML IV EMUL
INTRAVENOUS | Status: DC | PRN
  Administered 2024-02-20: 75 ug/kg/min via INTRAVENOUS

## 2024-02-20 MED ORDER — PROPOFOL 1000 MG/100ML IV EMUL
INTRAVENOUS | Status: AC
  Filled 2024-02-20: qty 100

## 2024-02-20 MED ORDER — LIDOCAINE HCL (CARDIAC) PF 100 MG/5ML IV SOSY
PREFILLED_SYRINGE | INTRAVENOUS | Status: DC | PRN
  Administered 2024-02-20: 80 mg via INTRAVENOUS

## 2024-02-20 MED ORDER — DEXMEDETOMIDINE HCL IN NACL 80 MCG/20ML IV SOLN
INTRAVENOUS | Status: DC | PRN
  Administered 2024-02-20: 8 ug via INTRAVENOUS
  Administered 2024-02-20: 12 ug via INTRAVENOUS

## 2024-02-20 NOTE — Transfer of Care (Signed)
 Immediate Anesthesia Transfer of Care Note  Patient: Derek Joseph  Procedure(s) Performed: COLONOSCOPY  Patient Location: PACU  Anesthesia Type:General  Level of Consciousness: sedated  Airway & Oxygen Therapy: Patient Spontanous Breathing  Post-op Assessment: Report given to RN and Post -op Vital signs reviewed and stable  Post vital signs: Reviewed and stable  Last Vitals:  Vitals Value Taken Time  BP 103/65 02/20/24 10:18  Temp    Pulse 61 02/20/24 10:18  Resp 17 02/20/24 10:18  SpO2 97 % 02/20/24 10:18  Vitals shown include unfiled device data.  Last Pain:  Vitals:   02/20/24 0908  TempSrc: Temporal  PainSc: 0-No pain         Complications: No notable events documented.

## 2024-02-20 NOTE — Anesthesia Preprocedure Evaluation (Addendum)
 Anesthesia Evaluation  Patient identified by MRN, date of birth, ID band Patient awake    Reviewed: Allergy & Precautions, NPO status , Patient's Chart, lab work & pertinent test results  History of Anesthesia Complications Negative for: history of anesthetic complications  Airway Mallampati: IV  TM Distance: >3 FB Neck ROM: full    Dental  (+) Chipped   Pulmonary COPD, former smoker   Pulmonary exam normal        Cardiovascular negative cardio ROS Normal cardiovascular exam     Neuro/Psych negative neurological ROS  negative psych ROS   GI/Hepatic Neg liver ROS,GERD  ,,  Endo/Other    Class 3 obesity  Renal/GU negative Renal ROS  negative genitourinary   Musculoskeletal  (+) Arthritis ,    Abdominal   Peds  Hematology negative hematology ROS (+)   Anesthesia Other Findings Past Medical History: No date: Aneurysm of ascending aorta without rupture No date: Aortic atherosclerosis No date: Arthritis No date: Centrilobular emphysema (HCC) No date: Colon polyps No date: Diverticulosis No date: GERD (gastroesophageal reflux disease) 08/06/2019: Lab test positive for detection of COVID-19 virus No date: Morbid obesity (HCC) No date: Osteoarthritis No date: Pneumonia No date: Sclerosing mesenteritis (HCC) No date: Vitamin B12 deficiency No date: Vitamin D deficiency  Past Surgical History: 2000: APPENDECTOMY No date: COLONOSCOPY W/ POLYPECTOMY 10/21/2019: COLONOSCOPY WITH PROPOFOL ; N/A     Comment:  Procedure: COLONOSCOPY WITH PROPOFOL ;  Surgeon: [place],               Ladell POUR, MD;  Location: [facility];  Service:               Gastroenterology;  Laterality: N/A;  Patient tested               positive for COVID-19 on 08-06-19 (copy of results on               chart and is in EPIC) No date: HERNIA REPAIR 01/10/2022: INSERTION OF MESH     Comment:  Procedure: INSERTION OF MESH;  Surgeon: Dessa Reyes ORN, MD;  Location: [facility];  Service: General;; No date: JOINT REPLACEMENT 02/20/2019: KNEE ARTHROPLASTY; Left     Comment:  Procedure: COMPUTER ASSISTED TOTAL KNEE ARTHROPLASTY;                Surgeon: Mardee Lynwood SQUIBB, MD;  Location: [facility];                Service: Orthopedics;  Laterality: Left; 06/04/2021: KNEE ARTHROPLASTY; Right     Comment:  Procedure: COMPUTER ASSISTED TOTAL KNEE ARTHROPLASTY;                Surgeon: Mardee Lynwood SQUIBB, MD;  Location: [facility];                Service: Orthopedics;  Laterality: Right; No date: TONSILLECTOMY     Comment:  age 15  and adnoids 01/10/2022: VENTRAL HERNIA REPAIR; N/A     Comment:  Procedure: HERNIA REPAIR VENTRAL ADULT;  Surgeon:               Dessa Reyes ORN, MD;  Location: [facility];  Service:               General;  Laterality: N/A;  Shelba Rakers, RNFA to               assist  Reproductive/Obstetrics negative OB ROS                              Anesthesia Physical Anesthesia Plan  ASA: 3  Anesthesia Plan: General   Post-op Pain Management: Minimal or no pain anticipated   Induction: Intravenous  PONV Risk Score and Plan: 1 and Propofol  infusion and TIVA  Airway Management Planned: Natural Airway and Nasal Cannula  Additional Equipment:   Intra-op Plan:   Post-operative Plan:   Informed Consent: I have reviewed the patients History and Physical, chart, labs and discussed the procedure including the risks, benefits and alternatives for the proposed anesthesia with the patient or authorized representative who has indicated his/her understanding and acceptance.     Dental Advisory Given  Plan Discussed with: Anesthesiologist, CRNA and Surgeon  Anesthesia Plan Comments: (Patient consented for risks of anesthesia including but not limited to:  - adverse reactions to medications - risk of airway placement if required - damage to eyes, teeth, lips or other oral  mucosa - nerve damage due to positioning  - sore throat or hoarseness - Damage to heart, brain, nerves, lungs, other parts of body or loss of life  Patient voiced understanding and assent.)         Anesthesia Quick Evaluation

## 2024-02-20 NOTE — H&P (Signed)
 Outpatient short stay form Pre-procedure 02/20/2024 9:44 AM Shevelle Smither K. Aundria, M.D.  Primary Physician: Clotilda Leaven, NP  Reason for visit:    Diagnoses  Encounter for colonoscopy due to history of adenomatous colonic polyps  Diverticulosis     History of present illness:   58 year old patient presenting for family history of colon polyps. Patient denies any change in bowel habits, rectal bleeding or involuntary weight loss.     Current Facility-Administered Medications:    0.9 %  sodium chloride  infusion, , Intravenous, Continuous, Vandiver, Riely Oetken K, MD, Last Rate: 20 mL/hr at 02/20/24 0915, New Bag at 02/20/24 0915  Medications Prior to Admission  Medication Sig Dispense Refill Last Dose/Taking   aspirin 81 MG chewable tablet Chew 81 mg by mouth daily.   Past Week   celecoxib  (CELEBREX ) 200 MG capsule Take 1 capsule (200 mg total) by mouth 2 (two) times daily. 90 capsule 0 Past Week   cholecalciferol (VITAMIN D3) 25 MCG (1000 UNIT) tablet Take 1,000 Units by mouth daily.   Past Week   cyanocobalamin (VITAMIN B12) 500 MCG tablet Take 500 mcg by mouth daily.   Past Week   lovastatin (MEVACOR) 20 MG tablet Take 20 mg by mouth daily after supper.   Past Week   acetaminophen  (TYLENOL ) 500 MG tablet Take 1,000 mg by mouth every 8 (eight) hours as needed for mild pain.      omeprazole (PRILOSEC) 20 MG capsule Take 20 mg by mouth 2 (two) times daily before a meal.        Allergies  Allergen Reactions   Codeine     Constipation: immediate and severe     Past Medical History:  Diagnosis Date   Aneurysm of ascending aorta without rupture    Aortic atherosclerosis    Arthritis    Centrilobular emphysema (HCC)    Colon polyps    Diverticulosis    GERD (gastroesophageal reflux disease)    Lab test positive for detection of COVID-19 virus 08/06/2019   Morbid obesity (HCC)    Osteoarthritis    Pneumonia    Sclerosing mesenteritis (HCC)    Vitamin B12 deficiency    Vitamin D  deficiency     Review of systems:  Otherwise negative.    Physical Exam  Gen: Alert, oriented. Appears stated age.  HEENT: [place]/AT. PERRLA. Lungs: CTA, no wheezes. CV: RR nl S1, S2. Abd: soft, benign, no masses. BS+ Ext: No edema. Pulses 2+    Planned procedures: Proceed with colonoscopy. The patient understands the nature of the planned procedure, indications, risks, alternatives and potential complications including but not limited to bleeding, infection, perforation, damage to internal organs and possible oversedation/side effects from anesthesia. The patient agrees and gives consent to proceed.  Please refer to procedure notes for findings, recommendations and patient disposition/instructions.     Anali Cabanilla K. Aundria, M.D. Gastroenterology 02/20/2024  9:44 AM

## 2024-02-20 NOTE — Anesthesia Postprocedure Evaluation (Signed)
 Anesthesia Post Note  Patient: Derek Joseph  Procedure(s) Performed: COLONOSCOPY  Patient location during evaluation: Endoscopy Anesthesia Type: General Level of consciousness: awake and alert Pain management: pain level controlled Vital Signs Assessment: post-procedure vital signs reviewed and stable Respiratory status: spontaneous breathing, nonlabored ventilation, respiratory function stable and patient connected to nasal cannula oxygen Cardiovascular status: blood pressure returned to baseline and stable Postop Assessment: no apparent nausea or vomiting Anesthetic complications: no   No notable events documented.   Last Vitals:  Vitals:   02/20/24 1027 02/20/24 1036  BP: 116/74 111/80  Pulse: (!) 54 (!) 53  Resp: 15 16  Temp:    SpO2: 98% 98%    Last Pain:  Vitals:   02/20/24 1036  TempSrc:   PainSc: 0-No pain                 Lendia LITTIE Mae

## 2024-02-20 NOTE — Op Note (Addendum)
 [facility] Gastroenterology Patient Name: Derek Joseph Procedure Date: 02/20/2024 9:43 AM MRN: [id number] Account #: [id number] Date of Birth: 07-07-1965 Admit Type: Outpatient Age: 58 Room: [facility] ENDO ROOM 1 Gender: Male Note Status: Supervisor Override Instrument Name: Colon Scope [id number] Procedure:             Colonoscopy Indications:           High risk colon cancer surveillance: Personal history                         of multiple (3 or more) adenomas, Diverticulosis of                         the colon Providers:             Mailee Klaas K. Nathaneil Feagans MD, MD Medicines:             Propofol  per Anesthesia Complications:         No immediate complications. Estimated blood loss:                         Minimal. Procedure:             Pre-Anesthesia Assessment:                        - The risks and benefits of the procedure and the                         sedation options and risks were discussed with the                         patient. All questions were answered and informed                         consent was obtained.                        - Patient identification and proposed procedure were                         verified prior to the procedure by the nurse. The                         procedure was verified in the procedure room.                        - ASA Grade Assessment: III - A patient with severe                         systemic disease.                        - After reviewing the risks and benefits, the patient                         was deemed in satisfactory condition to undergo the                         procedure.  After obtaining informed consent, the colonoscope was                         passed under direct vision. Throughout the procedure,                         the patient's blood pressure, pulse, and oxygen                         saturations were monitored continuously. The                         Colonoscope was  introduced through the anus and                         advanced to the the cecum, identified by appendiceal                         orifice and ileocecal valve. The colonoscopy was                         performed without difficulty. The patient tolerated                         the procedure well. The quality of the bowel                         preparation was fair. The ileocecal valve, appendiceal                         orifice, and rectum were photographed. Findings:      The perianal and digital rectal examinations were normal. Pertinent       negatives include normal sphincter tone and no palpable rectal lesions.      Non-bleeding internal hemorrhoids were found during retroflexion. The       hemorrhoids were Grade I (internal hemorrhoids that do not prolapse).      Many small-mouthed diverticula were found in the sigmoid colon.      Two sessile polyps were found in the transverse colon and ascending       colon. The polyps were 3 to 5 mm in size. These polyps were removed with       a jumbo cold forceps. Resection and retrieval were complete. Estimated       blood loss was minimal.      The exam was otherwise without abnormality. Impression:            - Preparation of the colon was fair.                        - Non-bleeding internal hemorrhoids.                        - Diverticulosis in the sigmoid colon.                        - Two 3 to 5 mm polyps in the transverse colon and in                         the ascending  colon, removed with a jumbo cold                         forceps. Resected and retrieved.                        - The examination was otherwise normal. Recommendation:        - Patient has a contact number available for                         emergencies. The signs and symptoms of potential                         delayed complications were discussed with the patient.                         Return to normal activities tomorrow. Written                          discharge instructions were provided to the patient.                        - Resume previous diet.                        - Continue present medications.                        - Await pathology results.                        - Repeat colonoscopy in 5 years for surveillance.                        - Return to GI office PRN.                        - The findings and recommendations were discussed with                         the patient. Procedure Code(s):     --- Professional ---                        [phone], Colonoscopy, flexible; with biopsy, single or                         multiple Diagnosis Code(s):     --- Professional ---                        K57.30, Diverticulosis of large intestine without                         perforation or abscess without bleeding                        D12.2, Benign neoplasm of ascending colon                        D12.3, Benign neoplasm of transverse colon (hepatic  flexure or splenic flexure)                        K64.0, First degree hemorrhoids                        Z86.010, Personal history of colonic polyps CPT copyright 2022 [employer]. All rights reserved. The codes documented in this report are preliminary and upon coder review may  be revised to meet current compliance requirements. Ladell MARLA Boss MD, MD 02/20/2024 10:16:23 AM This report has been signed electronically. Number of Addenda: 0 Note Initiated On: 02/20/2024 9:43 AM Scope Withdrawal Time: 0 hours 6 minutes 12 seconds  Total Procedure Duration: 0 hours 9 minutes 33 seconds  Estimated Blood Loss:  Estimated blood loss was minimal. Estimated blood loss                         was minimal. Estimated blood loss: none.      [facility]

## 2024-02-20 NOTE — Interval H&P Note (Signed)
 History and Physical Interval Note:  02/20/2024 9:46 AM  Derek Joseph  has presented today for surgery, with the diagnosis of Encounter for colonoscopy due to history of adenomatous colonic polyps (Z12.11,Z86.0101) Diverticulosis (K57.90).  The various methods of treatment have been discussed with the patient and family. After consideration of risks, benefits and other options for treatment, the patient has consented to  Procedure(s): COLONOSCOPY (N/A) as a surgical intervention.  The patient's history has been reviewed, patient examined, no change in status, stable for surgery.  I have reviewed the patient's chart and labs.  Questions were answered to the patient's satisfaction.     [place], Milley Vining

## 2024-02-20 NOTE — Interval H&P Note (Signed)
 History and Physical Interval Note:  02/20/2024 9:45 AM  Derek Joseph  has presented today for surgery, with the diagnosis of Encounter for colonoscopy due to history of adenomatous colonic polyps (Z12.11,Z86.0101) Diverticulosis (K57.90).  The various methods of treatment have been discussed with the patient and family. After consideration of risks, benefits and other options for treatment, the patient has consented to  Procedure(s): COLONOSCOPY (N/A) as a surgical intervention.  The patient's history has been reviewed, patient examined, no change in status, stable for surgery.  I have reviewed the patient's chart and labs.  Questions were answered to the patient's satisfaction.     [place], Cole [place]ridge

## 2024-02-21 LAB — SURGICAL PATHOLOGY

## 2024-03-13 ENCOUNTER — Encounter: Payer: Self-pay | Admitting: General Surgery
